# Patient Record
Sex: Female | Born: 1960 | Race: White | Hispanic: No | Marital: Married | Smoking: Never smoker
Health system: Southern US, Community
[De-identification: ages and names within clinical notes are randomized; demographics above are authoritative.]

## PROBLEM LIST (undated history)

## (undated) DIAGNOSIS — Z9221 Personal history of antineoplastic chemotherapy: Secondary | ICD-10-CM

## (undated) DIAGNOSIS — C50919 Malignant neoplasm of unspecified site of unspecified female breast: Principal | ICD-10-CM

## (undated) DIAGNOSIS — Z923 Personal history of irradiation: Secondary | ICD-10-CM

## (undated) DIAGNOSIS — I890 Lymphedema, not elsewhere classified: Secondary | ICD-10-CM

## (undated) HISTORY — DX: Malignant neoplasm of unspecified site of unspecified female breast: C50.919

## (undated) HISTORY — DX: Lymphedema, not elsewhere classified: I89.0

---

## 1998-02-06 ENCOUNTER — Other Ambulatory Visit: Admission: RE | Admit: 1998-02-06 | Discharge: 1998-02-06 | Payer: Self-pay | Admitting: *Deleted

## 1999-07-07 ENCOUNTER — Other Ambulatory Visit: Admission: RE | Admit: 1999-07-07 | Discharge: 1999-07-07 | Payer: Self-pay | Admitting: *Deleted

## 2000-07-27 ENCOUNTER — Other Ambulatory Visit: Admission: RE | Admit: 2000-07-27 | Discharge: 2000-07-27 | Payer: Self-pay | Admitting: *Deleted

## 2001-09-05 ENCOUNTER — Other Ambulatory Visit: Admission: RE | Admit: 2001-09-05 | Discharge: 2001-09-05 | Payer: Self-pay | Admitting: *Deleted

## 2002-09-10 ENCOUNTER — Other Ambulatory Visit: Admission: RE | Admit: 2002-09-10 | Discharge: 2002-09-10 | Payer: Self-pay | Admitting: *Deleted

## 2003-10-15 ENCOUNTER — Other Ambulatory Visit: Admission: RE | Admit: 2003-10-15 | Discharge: 2003-10-15 | Payer: Self-pay | Admitting: *Deleted

## 2005-04-15 ENCOUNTER — Observation Stay (HOSPITAL_COMMUNITY): Admission: RE | Admit: 2005-04-15 | Discharge: 2005-04-16 | Payer: Self-pay | Admitting: Gynecology

## 2005-04-15 ENCOUNTER — Encounter (INDEPENDENT_AMBULATORY_CARE_PROVIDER_SITE_OTHER): Payer: Self-pay | Admitting: Specialist

## 2006-03-13 ENCOUNTER — Ambulatory Visit: Payer: Self-pay | Admitting: Gynecology

## 2006-03-14 ENCOUNTER — Ambulatory Visit (HOSPITAL_COMMUNITY): Admission: RE | Admit: 2006-03-14 | Discharge: 2006-03-14 | Payer: Self-pay | Admitting: Gynecology

## 2007-03-19 ENCOUNTER — Ambulatory Visit: Payer: Self-pay | Admitting: Gynecology

## 2007-03-30 ENCOUNTER — Encounter: Admission: RE | Admit: 2007-03-30 | Discharge: 2007-03-30 | Payer: Self-pay | Admitting: Gynecology

## 2007-06-28 DIAGNOSIS — Z9221 Personal history of antineoplastic chemotherapy: Secondary | ICD-10-CM

## 2007-06-28 DIAGNOSIS — Z923 Personal history of irradiation: Secondary | ICD-10-CM

## 2007-06-28 HISTORY — PX: MASTECTOMY: SHX3

## 2007-06-28 HISTORY — DX: Personal history of antineoplastic chemotherapy: Z92.21

## 2007-06-28 HISTORY — DX: Personal history of irradiation: Z92.3

## 2008-01-26 HISTORY — PX: MASTECTOMY, MODIFIED RADICAL W/RECONSTRUCTION: SHX708

## 2008-03-04 ENCOUNTER — Encounter (INDEPENDENT_AMBULATORY_CARE_PROVIDER_SITE_OTHER): Payer: Self-pay | Admitting: Diagnostic Radiology

## 2008-03-04 ENCOUNTER — Encounter: Admission: RE | Admit: 2008-03-04 | Discharge: 2008-03-04 | Payer: Self-pay | Admitting: Gynecology

## 2008-03-05 HISTORY — PX: BREAST BIOPSY: SHX20

## 2008-03-11 ENCOUNTER — Encounter: Admission: RE | Admit: 2008-03-11 | Discharge: 2008-03-11 | Payer: Self-pay | Admitting: Gynecology

## 2008-03-26 ENCOUNTER — Ambulatory Visit (HOSPITAL_COMMUNITY): Admission: RE | Admit: 2008-03-26 | Discharge: 2008-03-27 | Payer: Self-pay | Admitting: Surgery

## 2008-03-26 ENCOUNTER — Encounter (INDEPENDENT_AMBULATORY_CARE_PROVIDER_SITE_OTHER): Payer: Self-pay | Admitting: Surgery

## 2008-03-27 ENCOUNTER — Ambulatory Visit: Payer: Self-pay | Admitting: Oncology

## 2008-04-02 ENCOUNTER — Ambulatory Visit: Payer: Self-pay | Admitting: Oncology

## 2008-04-07 LAB — CBC WITH DIFFERENTIAL (CANCER CENTER ONLY)
BASO%: 1.4 % (ref 0.0–2.0)
Eosinophils Absolute: 0.8 10*3/uL — ABNORMAL HIGH (ref 0.0–0.5)
LYMPH#: 3.2 10*3/uL (ref 0.9–3.3)
MCV: 89 fL (ref 81–101)
MONO#: 0.7 10*3/uL (ref 0.1–0.9)
NEUT#: 8 10*3/uL — ABNORMAL HIGH (ref 1.5–6.5)
Platelets: 381 10*3/uL (ref 145–400)
RBC: 4.35 10*6/uL (ref 3.70–5.32)
RDW: 11.3 % (ref 10.5–14.6)
WBC: 12.9 10*3/uL — ABNORMAL HIGH (ref 3.9–10.0)

## 2008-04-07 LAB — LACTATE DEHYDROGENASE: LDH: 157 U/L (ref 94–250)

## 2008-04-07 LAB — CMP (CANCER CENTER ONLY)
ALT(SGPT): 35 U/L (ref 10–47)
Alkaline Phosphatase: 51 U/L (ref 26–84)
CO2: 29 mEq/L (ref 18–33)
Creat: 0.5 mg/dl — ABNORMAL LOW (ref 0.6–1.2)
Sodium: 139 mEq/L (ref 128–145)
Total Bilirubin: 0.5 mg/dl (ref 0.20–1.60)
Total Protein: 7.7 g/dL (ref 6.4–8.1)

## 2008-04-07 LAB — CANCER ANTIGEN 27.29: CA 27.29: 26 U/mL (ref 0–39)

## 2008-04-10 ENCOUNTER — Ambulatory Visit (HOSPITAL_COMMUNITY): Admission: RE | Admit: 2008-04-10 | Discharge: 2008-04-10 | Payer: Self-pay | Admitting: Oncology

## 2008-04-15 ENCOUNTER — Ambulatory Visit: Admission: RE | Admit: 2008-04-15 | Discharge: 2008-04-29 | Payer: Self-pay | Admitting: Radiation Oncology

## 2008-04-18 ENCOUNTER — Ambulatory Visit (HOSPITAL_COMMUNITY): Admission: RE | Admit: 2008-04-18 | Discharge: 2008-04-18 | Payer: Self-pay | Admitting: Surgery

## 2008-04-22 LAB — CMP (CANCER CENTER ONLY)
ALT(SGPT): 28 U/L (ref 10–47)
AST: 26 U/L (ref 11–38)
Albumin: 4 g/dL (ref 3.3–5.5)
BUN, Bld: 11 mg/dL (ref 7–22)
CO2: 23 mEq/L (ref 18–33)
Calcium: 9.5 mg/dL (ref 8.0–10.3)
Chloride: 104 mEq/L (ref 98–108)
Creat: 0.3 mg/dl — ABNORMAL LOW (ref 0.6–1.2)
Potassium: 3.5 mEq/L (ref 3.3–4.7)

## 2008-04-22 LAB — CBC WITH DIFFERENTIAL (CANCER CENTER ONLY)
BASO#: 0.2 10*3/uL (ref 0.0–0.2)
BASO%: 1 % (ref 0.0–2.0)
Eosinophils Absolute: 0.1 10*3/uL (ref 0.0–0.5)
HCT: 39.3 % (ref 34.8–46.6)
HGB: 13.6 g/dL (ref 11.6–15.9)
LYMPH#: 1.6 10*3/uL (ref 0.9–3.3)
LYMPH%: 10.4 % — ABNORMAL LOW (ref 14.0–48.0)
MCV: 91 fL (ref 81–101)
MONO#: 0.6 10*3/uL (ref 0.1–0.9)
NEUT%: 84.1 % — ABNORMAL HIGH (ref 39.6–80.0)
RBC: 4.34 10*6/uL (ref 3.70–5.32)
RDW: 11.7 % (ref 10.5–14.6)
WBC: 15.6 10*3/uL — ABNORMAL HIGH (ref 3.9–10.0)

## 2008-04-29 LAB — CBC WITH DIFFERENTIAL (CANCER CENTER ONLY)
Platelets: 166 10*3/uL (ref 145–400)
RBC: 4.29 10*6/uL (ref 3.70–5.32)
WBC: 14.6 10*3/uL — ABNORMAL HIGH (ref 3.9–10.0)

## 2008-04-29 LAB — BASIC METABOLIC PANEL - CANCER CENTER ONLY
CO2: 28 mEq/L (ref 18–33)
Calcium: 9.1 mg/dL (ref 8.0–10.3)
Chloride: 96 mEq/L — ABNORMAL LOW (ref 98–108)
Sodium: 137 mEq/L (ref 128–145)

## 2008-04-29 LAB — MANUAL DIFFERENTIAL (CHCC SATELLITE)
ALC: 4.5 10*3/uL — ABNORMAL HIGH (ref 0.6–2.2)
ANC (CHCC HP manual diff): 8.2 10*3/uL — ABNORMAL HIGH (ref 1.5–6.7)
Band Neutrophils: 20 % — ABNORMAL HIGH (ref 0–10)
Eos: 6 % (ref 0–7)
PLT EST ~~LOC~~: ADEQUATE
Platelet Morphology: NORMAL
SEG: 34 % — ABNORMAL LOW (ref 40–75)

## 2008-05-12 LAB — CBC WITH DIFFERENTIAL (CANCER CENTER ONLY)
BASO#: 0.1 10*3/uL (ref 0.0–0.2)
EOS%: 1 % (ref 0.0–7.0)
HCT: 38.3 % (ref 34.8–46.6)
HGB: 13.2 g/dL (ref 11.6–15.9)
LYMPH%: 10.7 % — ABNORMAL LOW (ref 14.0–48.0)
MONO%: 1.1 % (ref 0.0–13.0)
NEUT#: 9.5 10*3/uL — ABNORMAL HIGH (ref 1.5–6.5)
Platelets: 442 10*3/uL — ABNORMAL HIGH (ref 145–400)
RBC: 4.26 10*6/uL (ref 3.70–5.32)
RDW: 12.6 % (ref 10.5–14.6)
WBC: 11.1 10*3/uL — ABNORMAL HIGH (ref 3.9–10.0)

## 2008-05-12 LAB — CMP (CANCER CENTER ONLY)
ALT(SGPT): 79 U/L — ABNORMAL HIGH (ref 10–47)
AST: 40 U/L — ABNORMAL HIGH (ref 11–38)
Albumin: 4.1 g/dL (ref 3.3–5.5)
Alkaline Phosphatase: 41 U/L (ref 26–84)
Potassium: 4.2 mEq/L (ref 3.3–4.7)
Sodium: 144 mEq/L (ref 128–145)
Total Bilirubin: 0.5 mg/dl (ref 0.20–1.60)
Total Protein: 8.1 g/dL (ref 6.4–8.1)

## 2008-05-15 ENCOUNTER — Ambulatory Visit: Payer: Self-pay | Admitting: Oncology

## 2008-05-20 LAB — CBC WITH DIFFERENTIAL (CANCER CENTER ONLY)
BASO%: 4.1 % — ABNORMAL HIGH (ref 0.0–2.0)
Eosinophils Absolute: 0.3 10*3/uL (ref 0.0–0.5)
LYMPH%: 26.8 % (ref 14.0–48.0)
MCH: 31.3 pg (ref 26.0–34.0)
MCV: 90 fL (ref 81–101)
MONO%: 31.4 % — ABNORMAL HIGH (ref 0.0–13.0)
NEUT#: 3.8 10*3/uL (ref 1.5–6.5)
Platelets: 191 10*3/uL (ref 145–400)
RBC: 4.13 10*6/uL (ref 3.70–5.32)
RDW: 12.4 % (ref 10.5–14.6)
WBC: 10.8 10*3/uL — ABNORMAL HIGH (ref 3.9–10.0)

## 2008-05-20 LAB — BASIC METABOLIC PANEL - CANCER CENTER ONLY
BUN, Bld: 11 mg/dL (ref 7–22)
Creat: 0.8 mg/dl (ref 0.6–1.2)
Glucose, Bld: 148 mg/dL — ABNORMAL HIGH (ref 73–118)

## 2008-05-20 LAB — TECHNOLOGIST REVIEW CHCC SATELLITE

## 2008-06-02 LAB — CBC WITH DIFFERENTIAL (CANCER CENTER ONLY)
BASO#: 0.1 10*3/uL (ref 0.0–0.2)
Eosinophils Absolute: 0.1 10*3/uL (ref 0.0–0.5)
HCT: 37.8 % (ref 34.8–46.6)
HGB: 12.8 g/dL (ref 11.6–15.9)
LYMPH#: 0.9 10*3/uL (ref 0.9–3.3)
MCV: 91 fL (ref 81–101)
MONO#: 0.1 10*3/uL (ref 0.1–0.9)
NEUT%: 88.1 % — ABNORMAL HIGH (ref 39.6–80.0)
WBC: 10.8 10*3/uL — ABNORMAL HIGH (ref 3.9–10.0)

## 2008-06-02 LAB — CMP (CANCER CENTER ONLY)
AST: 45 U/L — ABNORMAL HIGH (ref 11–38)
Albumin: 3.9 g/dL (ref 3.3–5.5)
Alkaline Phosphatase: 38 U/L (ref 26–84)
BUN, Bld: 11 mg/dL (ref 7–22)
Glucose, Bld: 173 mg/dL — ABNORMAL HIGH (ref 73–118)
Potassium: 4.6 mEq/L (ref 3.3–4.7)
Sodium: 140 mEq/L (ref 128–145)
Total Bilirubin: 0.5 mg/dl (ref 0.20–1.60)
Total Protein: 7.5 g/dL (ref 6.4–8.1)

## 2008-06-04 LAB — HEPATITIS B CORE ANTIBODY, TOTAL: Hep B Core Total Ab: NEGATIVE

## 2008-06-04 LAB — HEPATIC FUNCTION PANEL
ALT: 50 U/L — ABNORMAL HIGH (ref 0–35)
AST: 28 U/L (ref 0–37)
Bilirubin, Direct: 0.1 mg/dL (ref 0.0–0.3)
Indirect Bilirubin: 0.4 mg/dL (ref 0.0–0.9)
Total Protein: 6.7 g/dL (ref 6.0–8.3)

## 2008-06-04 LAB — HEPATITIS B SURFACE ANTIGEN: Hepatitis B Surface Ag: NEGATIVE

## 2008-06-04 LAB — GAMMA GT: GGT: 25 U/L (ref 7–51)

## 2008-06-09 ENCOUNTER — Ambulatory Visit (HOSPITAL_COMMUNITY): Admission: RE | Admit: 2008-06-09 | Discharge: 2008-06-09 | Payer: Self-pay | Admitting: Oncology

## 2008-06-10 LAB — CBC WITH DIFFERENTIAL (CANCER CENTER ONLY)
HGB: 12.3 g/dL (ref 11.6–15.9)
MCH: 30.9 pg (ref 26.0–34.0)
MCHC: 33.7 g/dL (ref 32.0–36.0)

## 2008-06-10 LAB — MANUAL DIFFERENTIAL (CHCC SATELLITE)
Band Neutrophils: 19 % — ABNORMAL HIGH (ref 0–10)
Eos: 3 % (ref 0–7)
LYMPH: 22 % (ref 14–48)
MONO: 17 % — ABNORMAL HIGH (ref 0–13)
PLT EST ~~LOC~~: ADEQUATE
Platelet Morphology: NORMAL
SEG: 35 % — ABNORMAL LOW (ref 40–75)

## 2008-06-10 LAB — CMP (CANCER CENTER ONLY)
AST: 28 U/L (ref 11–38)
Albumin: 3.8 g/dL (ref 3.3–5.5)
Alkaline Phosphatase: 57 U/L (ref 26–84)
BUN, Bld: 9 mg/dL (ref 7–22)
Creat: 0.9 mg/dl (ref 0.6–1.2)
Potassium: 3.8 mEq/L (ref 3.3–4.7)

## 2008-06-12 ENCOUNTER — Ambulatory Visit (HOSPITAL_COMMUNITY): Admission: RE | Admit: 2008-06-12 | Discharge: 2008-06-12 | Payer: Self-pay | Admitting: Oncology

## 2008-06-23 LAB — CBC WITH DIFFERENTIAL (CANCER CENTER ONLY)
BASO%: 1.2 % (ref 0.0–2.0)
LYMPH%: 17.4 % (ref 14.0–48.0)
MCV: 92 fL (ref 81–101)
MONO#: 0.9 10*3/uL (ref 0.1–0.9)
MONO%: 10.1 % (ref 0.0–13.0)
Platelets: 278 10*3/uL (ref 145–400)
RDW: 14 % (ref 10.5–14.6)
WBC: 9 10*3/uL (ref 3.9–10.0)

## 2008-06-23 LAB — LIPID PANEL
LDL Cholesterol: 136 mg/dL — ABNORMAL HIGH (ref 0–99)
VLDL: 30 mg/dL (ref 0–40)

## 2008-06-23 LAB — CMP (CANCER CENTER ONLY)
AST: 29 U/L (ref 11–38)
Albumin: 3.5 g/dL (ref 3.3–5.5)
Alkaline Phosphatase: 40 U/L (ref 26–84)
Potassium: 4 mEq/L (ref 3.3–4.7)
Sodium: 140 mEq/L (ref 128–145)
Total Protein: 6.7 g/dL (ref 6.4–8.1)

## 2008-06-30 ENCOUNTER — Ambulatory Visit: Payer: Self-pay | Admitting: Oncology

## 2008-07-01 LAB — CMP (CANCER CENTER ONLY)
ALT(SGPT): 24 U/L (ref 10–47)
Albumin: 3.6 g/dL (ref 3.3–5.5)
Alkaline Phosphatase: 53 U/L (ref 26–84)
Glucose, Bld: 136 mg/dL — ABNORMAL HIGH (ref 73–118)
Potassium: 3.8 mEq/L (ref 3.3–4.7)
Sodium: 139 mEq/L (ref 128–145)
Total Protein: 6.7 g/dL (ref 6.4–8.1)

## 2008-07-01 LAB — MANUAL DIFFERENTIAL (CHCC SATELLITE)
ALC: 2 10*3/uL (ref 0.6–2.2)
Blasts: 2 % — ABNORMAL HIGH (ref 0–0)
Eos: 3 % (ref 0–7)
MONO: 13 % (ref 0–13)
Metamyelocytes: 1 % — ABNORMAL HIGH (ref 0–0)
Platelet Morphology: NORMAL
nRBC: 1 % — ABNORMAL HIGH (ref 0–0)

## 2008-07-01 LAB — CBC WITH DIFFERENTIAL (CANCER CENTER ONLY)
HGB: 11.6 g/dL (ref 11.6–15.9)
Platelets: 147 10*3/uL (ref 145–400)
RBC: 3.69 10*6/uL — ABNORMAL LOW (ref 3.70–5.32)
WBC: 6 10*3/uL (ref 3.9–10.0)

## 2008-07-07 LAB — URINALYSIS, MICROSCOPIC (CHCC SATELLITE)
Blood: NEGATIVE
Ketones: NEGATIVE mg/dL
Nitrite: NEGATIVE
Protein: NEGATIVE mg/dL
Specific Gravity, Urine: 1.005 (ref 1.003–1.035)
pH: 7.5 (ref 4.60–8.00)

## 2008-07-22 LAB — CMP (CANCER CENTER ONLY)
Albumin: 3.9 g/dL (ref 3.3–5.5)
BUN, Bld: 7 mg/dL (ref 7–22)
CO2: 27 mEq/L (ref 18–33)
Calcium: 9.8 mg/dL (ref 8.0–10.3)
Chloride: 98 mEq/L (ref 98–108)
Glucose, Bld: 129 mg/dL — ABNORMAL HIGH (ref 73–118)
Potassium: 4.1 mEq/L (ref 3.3–4.7)
Sodium: 138 mEq/L (ref 128–145)
Total Protein: 6.4 g/dL (ref 6.4–8.1)

## 2008-07-22 LAB — CBC WITH DIFFERENTIAL (CANCER CENTER ONLY)
BASO#: 0.1 10*3/uL (ref 0.0–0.2)
EOS%: 3.3 % (ref 0.0–7.0)
HCT: 33 % — ABNORMAL LOW (ref 34.8–46.6)
HGB: 11.2 g/dL — ABNORMAL LOW (ref 11.6–15.9)
LYMPH#: 1.7 10*3/uL (ref 0.9–3.3)
LYMPH%: 42.6 % (ref 14.0–48.0)
MCH: 31.2 pg (ref 26.0–34.0)
MCHC: 34 g/dL (ref 32.0–36.0)
MCV: 92 fL (ref 81–101)
MONO%: 28.6 % — ABNORMAL HIGH (ref 0.0–13.0)
NEUT%: 23.4 % — ABNORMAL LOW (ref 39.6–80.0)

## 2008-08-04 LAB — CMP (CANCER CENTER ONLY)
BUN, Bld: 12 mg/dL (ref 7–22)
CO2: 29 mEq/L (ref 18–33)
Calcium: 9.6 mg/dL (ref 8.0–10.3)
Chloride: 103 mEq/L (ref 98–108)
Creat: 0.9 mg/dl (ref 0.6–1.2)

## 2008-08-04 LAB — CBC WITH DIFFERENTIAL (CANCER CENTER ONLY)
EOS%: 0.8 % (ref 0.0–7.0)
Eosinophils Absolute: 0.1 10*3/uL (ref 0.0–0.5)
HGB: 12.1 g/dL (ref 11.6–15.9)
LYMPH#: 1.2 10*3/uL (ref 0.9–3.3)
MCH: 31.3 pg (ref 26.0–34.0)
MONO%: 1.5 % (ref 0.0–13.0)
NEUT#: 7.8 10*3/uL — ABNORMAL HIGH (ref 1.5–6.5)
Platelets: 340 10*3/uL (ref 145–400)
RBC: 3.86 10*6/uL (ref 3.70–5.32)

## 2008-08-12 LAB — MANUAL DIFFERENTIAL (CHCC SATELLITE)
ANC (CHCC HP manual diff): 1.2 10*3/uL — ABNORMAL LOW (ref 1.5–6.7)
LYMPH: 53 % — ABNORMAL HIGH (ref 14–48)
Metamyelocytes: 5 % — ABNORMAL HIGH (ref 0–0)
Myelocytes: 4 % — ABNORMAL HIGH (ref 0–0)
nRBC: 2 % — ABNORMAL HIGH (ref 0–0)

## 2008-08-12 LAB — CMP (CANCER CENTER ONLY)
ALT(SGPT): 24 U/L (ref 10–47)
CO2: 28 mEq/L (ref 18–33)
Calcium: 9 mg/dL (ref 8.0–10.3)
Chloride: 100 mEq/L (ref 98–108)
Sodium: 140 mEq/L (ref 128–145)
Total Protein: 6.2 g/dL — ABNORMAL LOW (ref 6.4–8.1)

## 2008-08-12 LAB — CBC WITH DIFFERENTIAL (CANCER CENTER ONLY)
HCT: 32.3 % — ABNORMAL LOW (ref 34.8–46.6)
MCHC: 34.2 g/dL (ref 32.0–36.0)
Platelets: 147 10*3/uL (ref 145–400)
RDW: 12.3 % (ref 10.5–14.6)
WBC: 3.8 10*3/uL — ABNORMAL LOW (ref 3.9–10.0)

## 2008-08-13 ENCOUNTER — Ambulatory Visit: Admission: RE | Admit: 2008-08-13 | Discharge: 2008-10-26 | Payer: Self-pay | Admitting: Radiation Oncology

## 2008-08-21 ENCOUNTER — Ambulatory Visit: Payer: Self-pay | Admitting: Vascular Surgery

## 2008-08-21 ENCOUNTER — Encounter: Payer: Self-pay | Admitting: Radiation Oncology

## 2008-08-21 ENCOUNTER — Ambulatory Visit: Admission: RE | Admit: 2008-08-21 | Discharge: 2008-08-21 | Payer: Self-pay | Admitting: Radiation Oncology

## 2008-08-27 ENCOUNTER — Ambulatory Visit: Payer: Self-pay | Admitting: Oncology

## 2008-08-28 LAB — CBC WITH DIFFERENTIAL (CANCER CENTER ONLY)
BASO%: 1 % (ref 0.0–2.0)
EOS%: 1.3 % (ref 0.0–7.0)
HCT: 34.5 % — ABNORMAL LOW (ref 34.8–46.6)
LYMPH%: 31.6 % (ref 14.0–48.0)
MCH: 30.9 pg (ref 26.0–34.0)
MCHC: 33.3 g/dL (ref 32.0–36.0)
MCV: 93 fL (ref 81–101)
MONO#: 1 10*3/uL — ABNORMAL HIGH (ref 0.1–0.9)
MONO%: 11.6 % (ref 0.0–13.0)
NEUT%: 54.5 % (ref 39.6–80.0)
Platelets: 332 10*3/uL (ref 145–400)
RDW: 13.3 % (ref 10.5–14.6)
WBC: 8.2 10*3/uL (ref 3.9–10.0)

## 2008-08-28 LAB — CMP (CANCER CENTER ONLY)
BUN, Bld: 12 mg/dL (ref 7–22)
CO2: 27 mEq/L (ref 18–33)
Calcium: 9.3 mg/dL (ref 8.0–10.3)
Chloride: 106 mEq/L (ref 98–108)
Creat: 0.7 mg/dl (ref 0.6–1.2)
Glucose, Bld: 99 mg/dL (ref 73–118)

## 2008-09-03 ENCOUNTER — Encounter: Admission: RE | Admit: 2008-09-03 | Discharge: 2008-09-24 | Payer: Self-pay | Admitting: Oncology

## 2008-09-30 LAB — CBC WITH DIFFERENTIAL (CANCER CENTER ONLY)
BASO#: 0 10*3/uL (ref 0.0–0.2)
Eosinophils Absolute: 0.6 10*3/uL — ABNORMAL HIGH (ref 0.0–0.5)
HCT: 36.7 % (ref 34.8–46.6)
LYMPH%: 21.6 % (ref 14.0–48.0)
MCH: 30.2 pg (ref 26.0–34.0)
MCV: 90 fL (ref 81–101)
MONO#: 0.4 10*3/uL (ref 0.1–0.9)
MONO%: 8 % (ref 0.0–13.0)
NEUT%: 58.3 % (ref 39.6–80.0)
Platelets: 227 10*3/uL (ref 145–400)
RBC: 4.09 10*6/uL (ref 3.70–5.32)
WBC: 5.3 10*3/uL (ref 3.9–10.0)

## 2008-10-01 ENCOUNTER — Ambulatory Visit: Payer: Self-pay | Admitting: Genetic Counselor

## 2008-10-20 ENCOUNTER — Ambulatory Visit: Payer: Self-pay | Admitting: Obstetrics & Gynecology

## 2008-11-03 ENCOUNTER — Ambulatory Visit: Payer: Self-pay | Admitting: Oncology

## 2008-11-04 LAB — CMP (CANCER CENTER ONLY)
ALT(SGPT): 27 U/L (ref 10–47)
AST: 25 U/L (ref 11–38)
Calcium: 9.9 mg/dL (ref 8.0–10.3)
Chloride: 98 mEq/L (ref 98–108)
Creat: 0.6 mg/dl (ref 0.6–1.2)
Total Bilirubin: 0.6 mg/dl (ref 0.20–1.60)

## 2008-11-04 LAB — CBC WITH DIFFERENTIAL (CANCER CENTER ONLY)
BASO#: 0 10*3/uL (ref 0.0–0.2)
EOS%: 6.9 % (ref 0.0–7.0)
Eosinophils Absolute: 0.4 10*3/uL (ref 0.0–0.5)
HGB: 13.8 g/dL (ref 11.6–15.9)
LYMPH#: 1.3 10*3/uL (ref 0.9–3.3)
NEUT#: 3.2 10*3/uL (ref 1.5–6.5)
Platelets: 264 10*3/uL (ref 145–400)
RBC: 4.54 10*6/uL (ref 3.70–5.32)
WBC: 5.3 10*3/uL (ref 3.9–10.0)

## 2008-12-16 LAB — CMP (CANCER CENTER ONLY)
AST: 26 U/L (ref 11–38)
Albumin: 3.3 g/dL (ref 3.3–5.5)
BUN, Bld: 12 mg/dL (ref 7–22)
Calcium: 9.6 mg/dL (ref 8.0–10.3)
Chloride: 101 mEq/L (ref 98–108)
Potassium: 4 mEq/L (ref 3.3–4.7)

## 2008-12-16 LAB — CBC WITH DIFFERENTIAL (CANCER CENTER ONLY)
BASO#: 0 10*3/uL (ref 0.0–0.2)
Eosinophils Absolute: 0.3 10*3/uL (ref 0.0–0.5)
HGB: 12.6 g/dL (ref 11.6–15.9)
MCH: 30.3 pg (ref 26.0–34.0)
MCV: 86 fL (ref 81–101)
MONO#: 0.4 10*3/uL (ref 0.1–0.9)
MONO%: 7 % (ref 0.0–13.0)
NEUT#: 3.3 10*3/uL (ref 1.5–6.5)
RBC: 4.17 10*6/uL (ref 3.70–5.32)
WBC: 5.7 10*3/uL (ref 3.9–10.0)

## 2008-12-25 HISTORY — PX: BILATERAL OOPHORECTOMY: SHX1221

## 2009-01-07 ENCOUNTER — Ambulatory Visit: Payer: Self-pay | Admitting: Obstetrics & Gynecology

## 2009-01-07 ENCOUNTER — Ambulatory Visit (HOSPITAL_COMMUNITY): Admission: RE | Admit: 2009-01-07 | Discharge: 2009-01-08 | Payer: Self-pay | Admitting: Obstetrics & Gynecology

## 2009-01-07 ENCOUNTER — Encounter: Payer: Self-pay | Admitting: Obstetrics & Gynecology

## 2009-01-12 ENCOUNTER — Ambulatory Visit: Payer: Self-pay | Admitting: Oncology

## 2009-01-14 LAB — CMP (CANCER CENTER ONLY)
Albumin: 3.2 g/dL — ABNORMAL LOW (ref 3.3–5.5)
Alkaline Phosphatase: 61 U/L (ref 26–84)
BUN, Bld: 10 mg/dL (ref 7–22)
Creat: 0.7 mg/dl (ref 0.6–1.2)
Glucose, Bld: 106 mg/dL (ref 73–118)
Potassium: 4 mEq/L (ref 3.3–4.7)
Total Bilirubin: 0.4 mg/dl (ref 0.20–1.60)

## 2009-01-14 LAB — CBC WITH DIFFERENTIAL (CANCER CENTER ONLY)
BASO#: 0.1 10*3/uL (ref 0.0–0.2)
Eosinophils Absolute: 0.6 10*3/uL — ABNORMAL HIGH (ref 0.0–0.5)
HCT: 35.1 % (ref 34.8–46.6)
HGB: 12.4 g/dL (ref 11.6–15.9)
MCH: 30.6 pg (ref 26.0–34.0)
MONO%: 7.6 % (ref 0.0–13.0)
NEUT#: 3.8 10*3/uL (ref 1.5–6.5)
NEUT%: 55.4 % (ref 39.6–80.0)
RBC: 4.06 10*6/uL (ref 3.70–5.32)

## 2009-01-19 ENCOUNTER — Encounter: Admission: RE | Admit: 2009-01-19 | Discharge: 2009-01-19 | Payer: Self-pay | Admitting: Oncology

## 2009-02-23 ENCOUNTER — Ambulatory Visit: Payer: Self-pay | Admitting: Oncology

## 2009-02-24 LAB — CMP (CANCER CENTER ONLY)
ALT(SGPT): 32 U/L (ref 10–47)
AST: 28 U/L (ref 11–38)
Albumin: 4.2 g/dL (ref 3.3–5.5)
Alkaline Phosphatase: 65 U/L (ref 26–84)
Potassium: 3.8 mEq/L (ref 3.3–4.7)
Sodium: 143 mEq/L (ref 128–145)
Total Protein: 7.2 g/dL (ref 6.4–8.1)

## 2009-02-24 LAB — CBC WITH DIFFERENTIAL (CANCER CENTER ONLY)
BASO#: 0.1 10*3/uL (ref 0.0–0.2)
EOS%: 5.4 % (ref 0.0–7.0)
Eosinophils Absolute: 0.2 10*3/uL (ref 0.0–0.5)
HCT: 38.2 % (ref 34.8–46.6)
HGB: 13 g/dL (ref 11.6–15.9)
LYMPH#: 1.4 10*3/uL (ref 0.9–3.3)
MCHC: 34 g/dL (ref 32.0–36.0)
MONO#: 0.4 10*3/uL (ref 0.1–0.9)
NEUT#: 2.2 10*3/uL (ref 1.5–6.5)
RBC: 4.19 10*6/uL (ref 3.70–5.32)
WBC: 4.2 10*3/uL (ref 3.9–10.0)

## 2009-03-04 ENCOUNTER — Ambulatory Visit: Payer: Self-pay | Admitting: Obstetrics & Gynecology

## 2009-03-27 ENCOUNTER — Encounter: Admission: RE | Admit: 2009-03-27 | Discharge: 2009-03-27 | Payer: Self-pay | Admitting: Oncology

## 2009-05-21 IMAGING — CR DG CHEST 1V PORT
1 series · 1 of 1 positions shown · non-contrast
Comparison: None

CLINICAL DATA: Assess Port-A-Cath placement..  Right breast CA.

PORTABLE CHEST - 1 VIEW

[view not recorded]
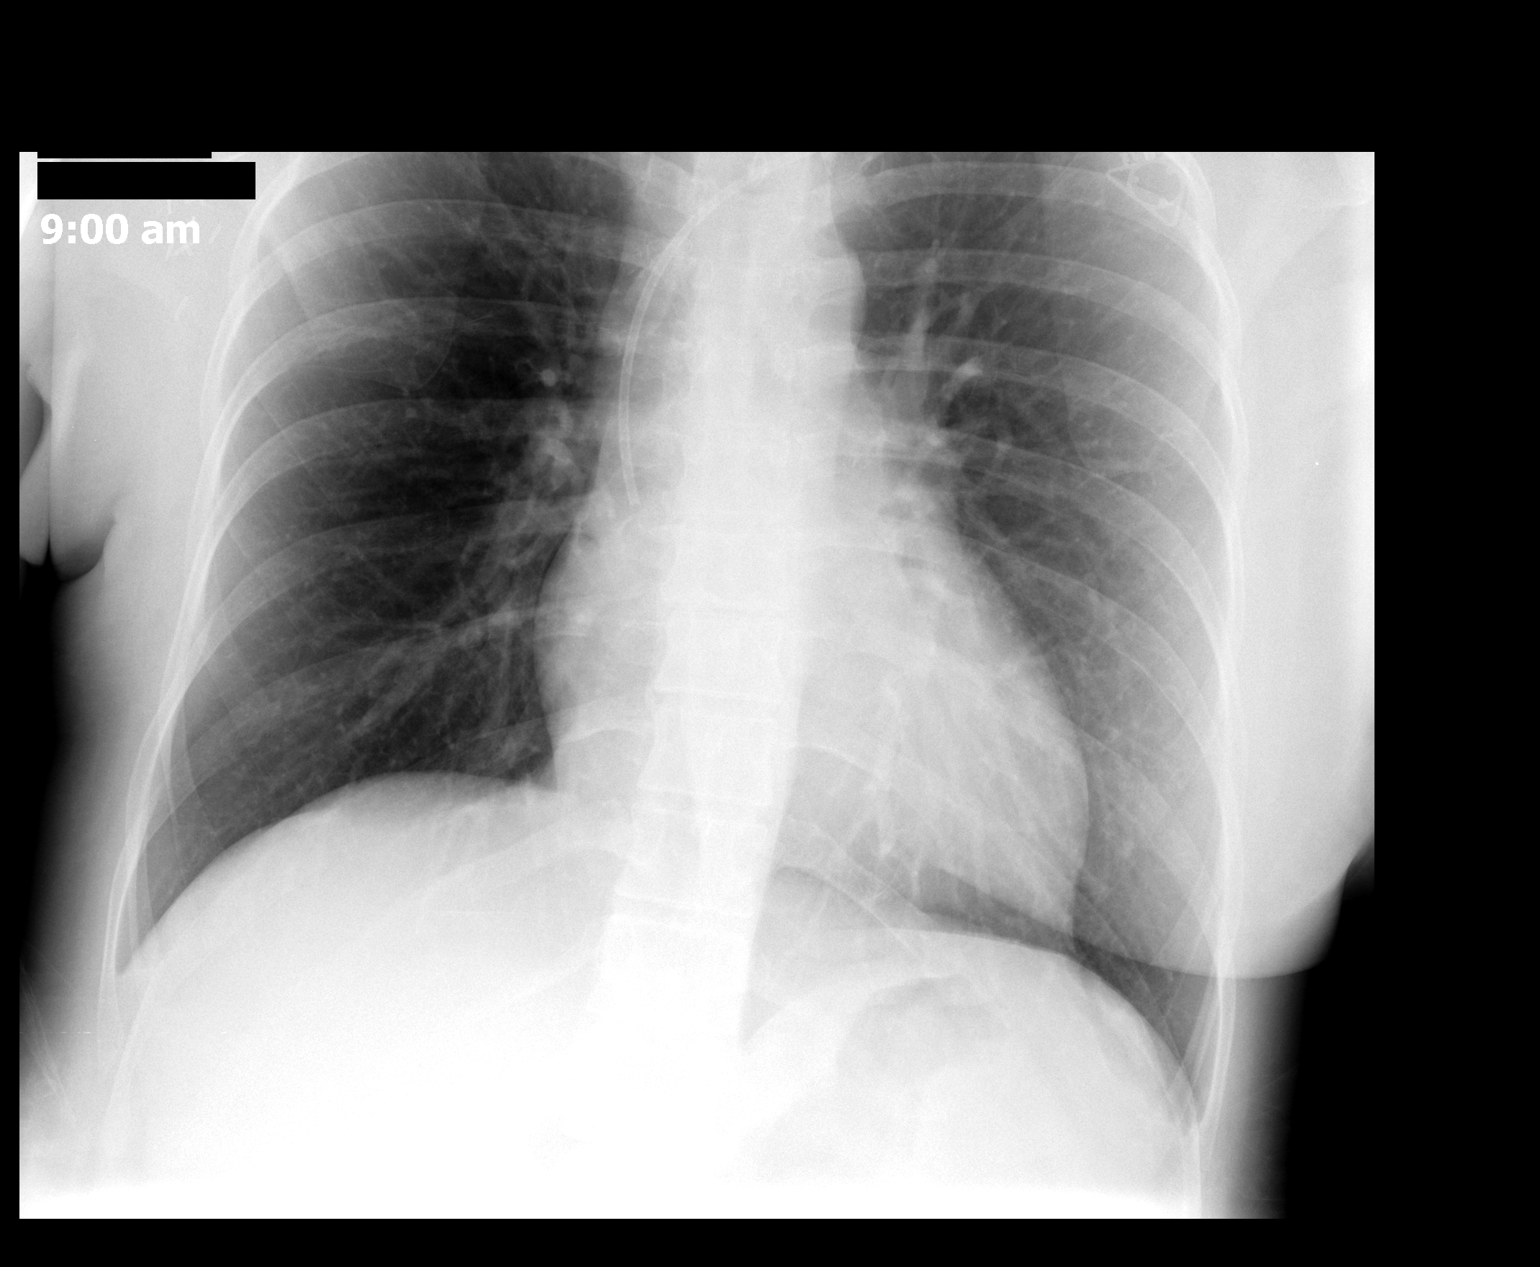

[1 of 1 positions shown; findings below may reference images not displayed]

FINDINGS: Port-A-Cath been inserted via the left subclavian vein
approach.  Catheter tip is in the lower SVC.  No pneumothorax.  No
evidence of active cardiopulmonary disease.
IMPRESSION: Port-A-Cath is in the lower SVC.  No pneumothorax.

## 2009-05-26 ENCOUNTER — Ambulatory Visit: Payer: Self-pay | Admitting: Oncology

## 2009-05-28 LAB — CMP (CANCER CENTER ONLY)
Albumin: 3.6 g/dL (ref 3.3–5.5)
BUN, Bld: 13 mg/dL (ref 7–22)
Calcium: 9.3 mg/dL (ref 8.0–10.3)
Chloride: 98 mEq/L (ref 98–108)
Creat: 0.6 mg/dl (ref 0.6–1.2)
Glucose, Bld: 93 mg/dL (ref 73–118)
Potassium: 3.8 mEq/L (ref 3.3–4.7)

## 2009-05-28 LAB — CBC WITH DIFFERENTIAL (CANCER CENTER ONLY)
BASO%: 0.6 % (ref 0.0–2.0)
EOS%: 12.9 % — ABNORMAL HIGH (ref 0.0–7.0)
LYMPH#: 1.8 10*3/uL (ref 0.9–3.3)
MCHC: 34.1 g/dL (ref 32.0–36.0)
MONO#: 0.5 10*3/uL (ref 0.1–0.9)
NEUT#: 3.2 10*3/uL (ref 1.5–6.5)
Platelets: 239 10*3/uL (ref 145–400)
RDW: 11 % (ref 10.5–14.6)
WBC: 6.4 10*3/uL (ref 3.9–10.0)

## 2009-07-12 IMAGING — US US ABDOMEN COMPLETE
1 series · 13 of 25 positions shown · non-contrast
Comparison: None

CLINICAL DATA: Elevated liver function tests.  Breast carcinoma.

ABDOMEN ULTRASOUND
TECHNIQUE: Complete abdominal ultrasound examination was performed
including evaluation of the liver, gallbladder, bile ducts,
pancreas, kidneys, spleen, IVC, and abdominal aorta.

[Series 1: unknown · 0.33mm/px · 13 of 82 slices shown]
[im 1/82]
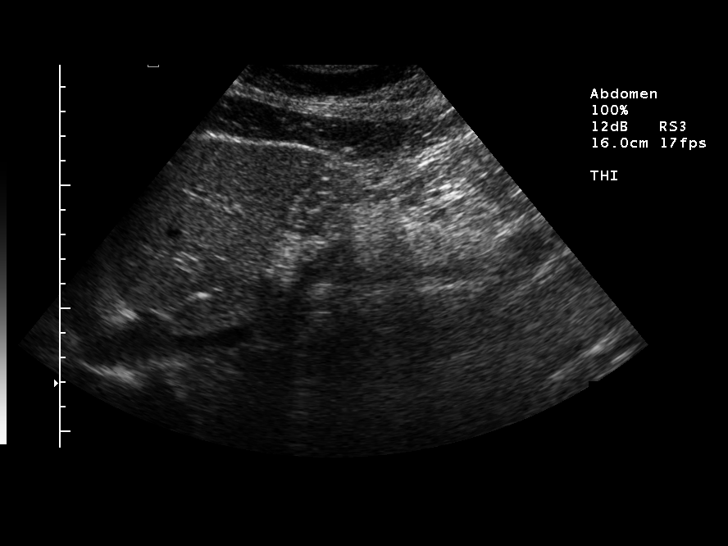
[im 7/82]
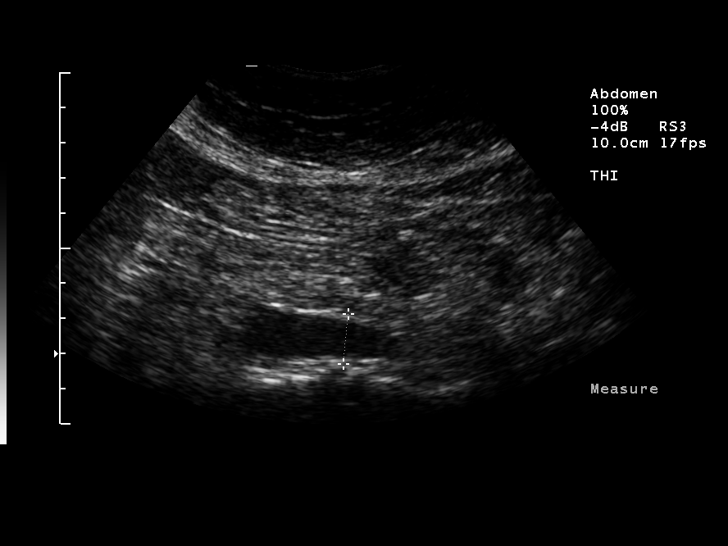
[im 14/82]
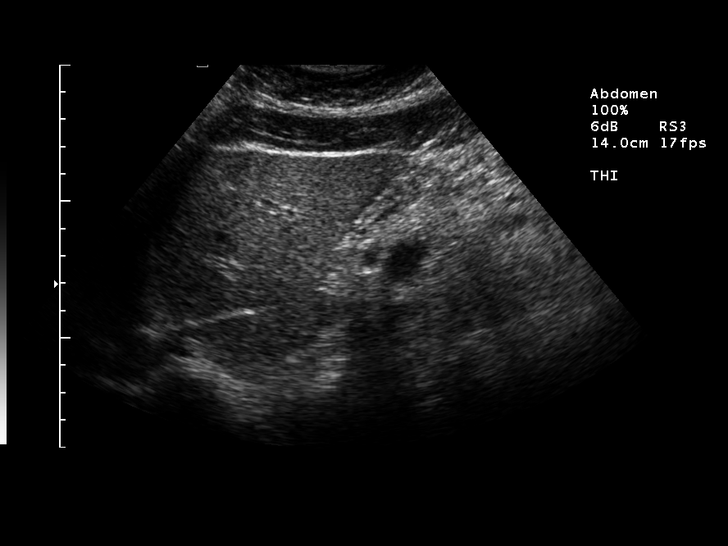
[im 21/82]
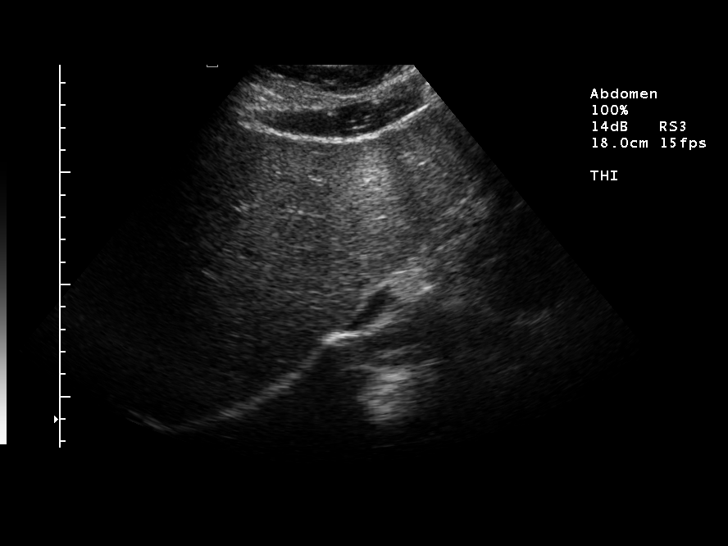
[im 28/82]
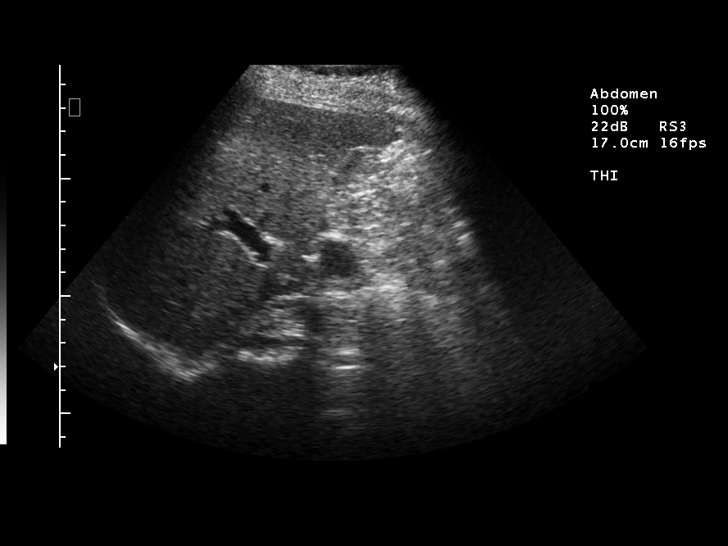
[im 34/82]
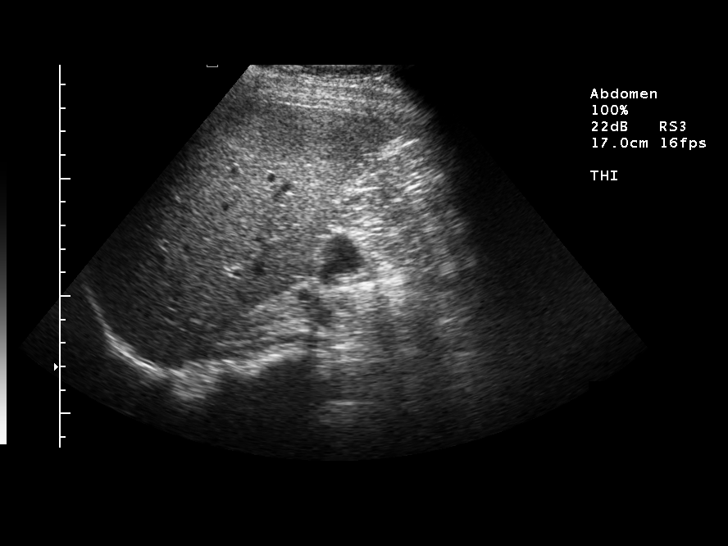
[im 41/82]
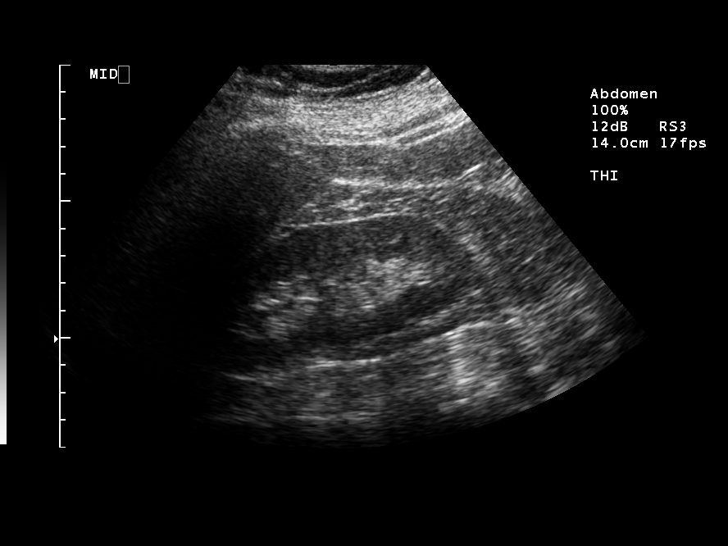
[im 48/82]
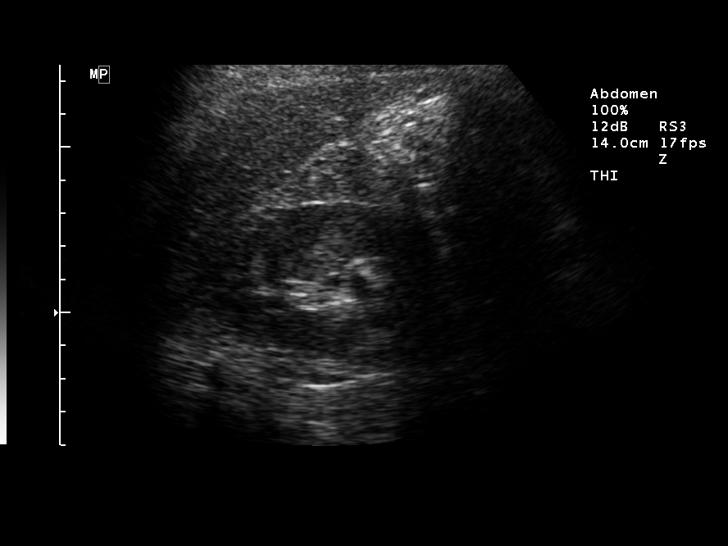
[im 55/82]
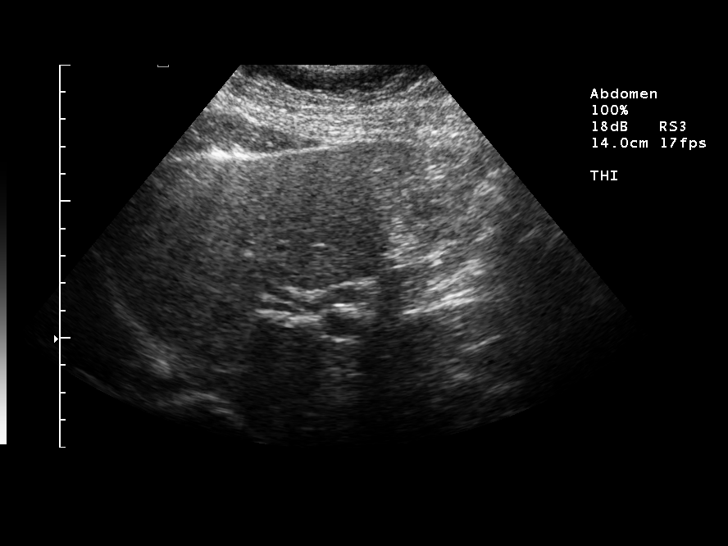
[im 61/82]
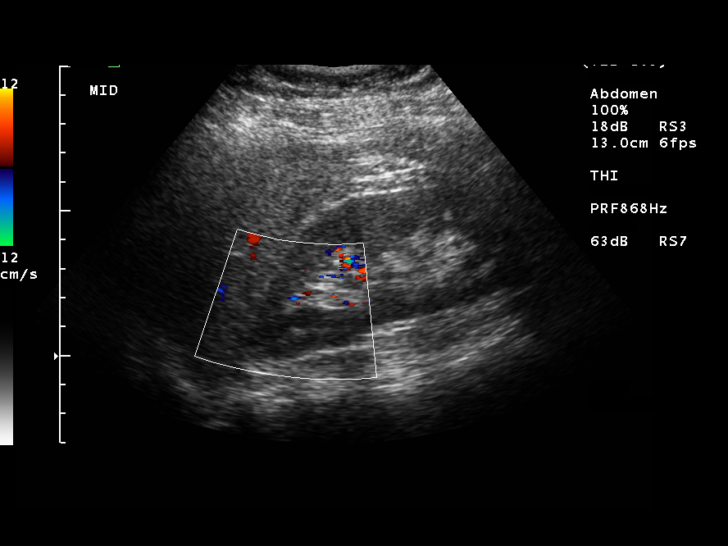
[im 68/82]
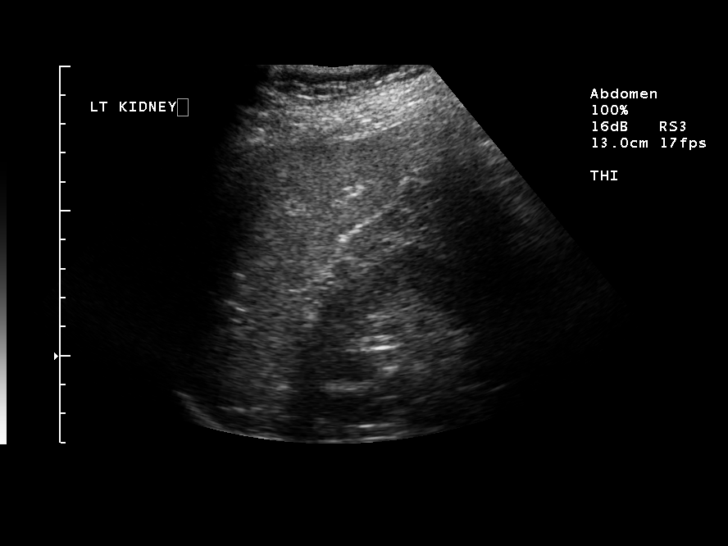
[im 75/82]
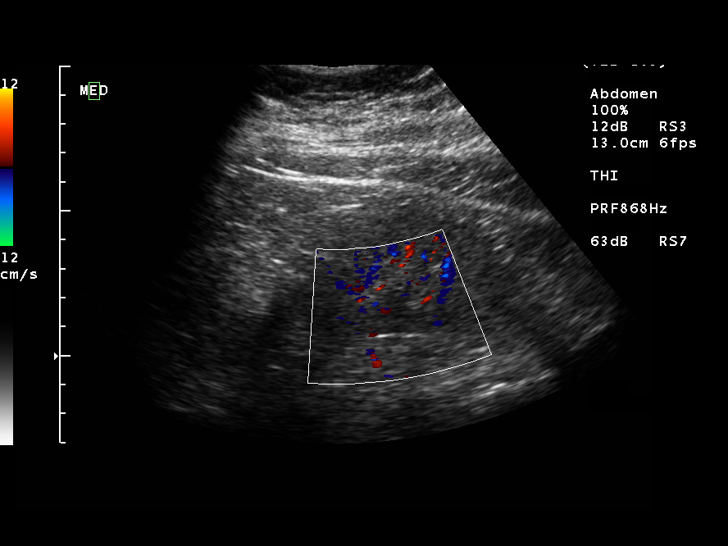
[im 82/82]
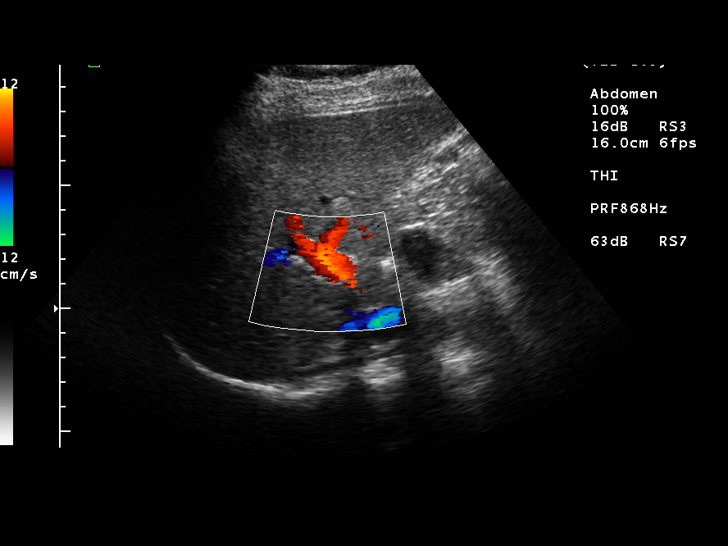

[13 of 25 positions shown; findings below may reference images not displayed]

FINDINGS: The gallbladder is surgically absent.  There is no
evidence of biliary ductal dilatation, with common bile duct
measuring approximately 5 mm.  The liver is within normal limits in
parenchymal echogenicity.  A subtle hyperechoic lesion is seen in
the right hepatic lobe measuring 9 mm in greatest diameter.  This
is nonspecific, and could represent a small hemangioma or
metastasis.  No other liver lesions are identified.

Visualized portions of the IVC and pancreas are unremarkable.
There is no evidence of splenomegaly.  Both kidneys are normal in
size in appearance.  There is no evidence of renal mass or
hydronephrosis.  There is no evidence of abdominal aortic aneurysm
or ascites.
IMPRESSION: 1.  Previous cholecystectomy.  No evidence of biliary ductal
dilatation.
2.  9 mm subtle hyperechoic lesion in the right hepatic lobe.
While this may represent a benign hemangioma, a small liver
metastasis cannot be excluded.  Abdomen MRI without with contrast
is recommend for further evaluation.

## 2010-01-07 ENCOUNTER — Ambulatory Visit: Payer: Self-pay | Admitting: Oncology

## 2010-01-13 LAB — CMP (CANCER CENTER ONLY)
Alkaline Phosphatase: 79 U/L (ref 26–84)
BUN, Bld: 12 mg/dL (ref 7–22)
CO2: 27 mEq/L (ref 18–33)
Creat: 0.8 mg/dl (ref 0.6–1.2)
Glucose, Bld: 88 mg/dL (ref 73–118)
Sodium: 133 mEq/L (ref 128–145)
Total Bilirubin: 0.7 mg/dl (ref 0.20–1.60)
Total Protein: 7.9 g/dL (ref 6.4–8.1)

## 2010-01-13 LAB — CBC WITH DIFFERENTIAL (CANCER CENTER ONLY)
BASO#: 0.1 10*3/uL (ref 0.0–0.2)
Eosinophils Absolute: 0.4 10*3/uL (ref 0.0–0.5)
HCT: 36.2 % (ref 34.8–46.6)
HGB: 12.5 g/dL (ref 11.6–15.9)
LYMPH%: 28.4 % (ref 14.0–48.0)
MCH: 31 pg (ref 26.0–34.0)
MCV: 90 fL (ref 81–101)
MONO#: 0.6 10*3/uL (ref 0.1–0.9)
MONO%: 7.4 % (ref 0.0–13.0)
NEUT%: 58.8 % (ref 39.6–80.0)
Platelets: 301 10*3/uL (ref 145–400)
RBC: 4.04 10*6/uL (ref 3.70–5.32)
WBC: 8 10*3/uL (ref 3.9–10.0)

## 2010-01-13 LAB — CANCER ANTIGEN 27.29: CA 27.29: 25 U/mL (ref 0–39)

## 2010-03-29 ENCOUNTER — Encounter: Admission: RE | Admit: 2010-03-29 | Discharge: 2010-03-29 | Payer: Self-pay | Admitting: Plastic Surgery

## 2010-04-01 ENCOUNTER — Encounter: Admission: RE | Admit: 2010-04-01 | Discharge: 2010-04-01 | Payer: Self-pay | Admitting: Plastic Surgery

## 2010-04-05 ENCOUNTER — Encounter: Admission: RE | Admit: 2010-04-05 | Discharge: 2010-04-05 | Payer: Self-pay | Admitting: Plastic Surgery

## 2010-04-07 HISTORY — PX: BREAST BIOPSY: SHX20

## 2010-07-12 ENCOUNTER — Ambulatory Visit: Payer: Self-pay | Admitting: Oncology

## 2010-07-14 LAB — CBC WITH DIFFERENTIAL/PLATELET
BASO%: 0.2 % (ref 0.0–2.0)
Basophils Absolute: 0 10*3/uL (ref 0.0–0.1)
EOS%: 2.6 % (ref 0.0–7.0)
Eosinophils Absolute: 0.2 10*3/uL (ref 0.0–0.5)
HCT: 43 % (ref 34.8–46.6)
HGB: 14.8 g/dL (ref 11.6–15.9)
LYMPH%: 31.6 % (ref 14.0–49.7)
MCH: 30.8 pg (ref 25.1–34.0)
MCHC: 34.3 g/dL (ref 31.5–36.0)
MCV: 90 fL (ref 79.5–101.0)
MONO#: 0.6 10*3/uL (ref 0.1–0.9)
MONO%: 8.4 % (ref 0.0–14.0)
NEUT#: 4 10*3/uL (ref 1.5–6.5)
NEUT%: 57.2 % (ref 38.4–76.8)
Platelets: 253 10*3/uL (ref 145–400)
RBC: 4.78 10*6/uL (ref 3.70–5.45)
RDW: 12.6 % (ref 11.2–14.5)
WBC: 7 10*3/uL (ref 3.9–10.3)
lymph#: 2.2 10*3/uL (ref 0.9–3.3)

## 2010-07-14 LAB — CANCER ANTIGEN 27.29: CA 27.29: 32 U/mL (ref 0–39)

## 2010-07-14 LAB — COMPREHENSIVE METABOLIC PANEL
ALT: 18 U/L (ref 0–35)
AST: 21 U/L (ref 0–37)
Albumin: 4.8 g/dL (ref 3.5–5.2)
Alkaline Phosphatase: 77 U/L (ref 39–117)
BUN: 11 mg/dL (ref 6–23)
CO2: 26 mEq/L (ref 19–32)
Calcium: 10.3 mg/dL (ref 8.4–10.5)
Chloride: 102 mEq/L (ref 96–112)
Creatinine, Ser: 0.64 mg/dL (ref 0.40–1.20)
Glucose, Bld: 90 mg/dL (ref 70–99)
Potassium: 4.1 mEq/L (ref 3.5–5.3)
Sodium: 141 mEq/L (ref 135–145)
Total Bilirubin: 0.5 mg/dL (ref 0.3–1.2)
Total Protein: 7.9 g/dL (ref 6.0–8.3)

## 2010-08-09 ENCOUNTER — Encounter: Payer: Self-pay | Admitting: Oncology

## 2010-08-26 ENCOUNTER — Encounter: Payer: Self-pay | Admitting: Oncology

## 2010-09-26 ENCOUNTER — Encounter: Payer: Self-pay | Admitting: Oncology

## 2010-10-03 LAB — CBC
HCT: 38.6 % (ref 36.0–46.0)
MCHC: 34.4 g/dL (ref 30.0–36.0)
MCV: 90.8 fL (ref 78.0–100.0)
Platelets: 236 10*3/uL (ref 150–400)
RBC: 4.25 MIL/uL (ref 3.87–5.11)
RDW: 12.9 % (ref 11.5–15.5)
WBC: 5.2 10*3/uL (ref 4.0–10.5)

## 2010-10-03 LAB — COMPREHENSIVE METABOLIC PANEL
AST: 25 U/L (ref 0–37)
Albumin: 3.9 g/dL (ref 3.5–5.2)
Alkaline Phosphatase: 50 U/L (ref 39–117)
BUN: 10 mg/dL (ref 6–23)
CO2: 25 mEq/L (ref 19–32)
Chloride: 105 mEq/L (ref 96–112)
Creatinine, Ser: 0.6 mg/dL (ref 0.4–1.2)
GFR calc Af Amer: >60 mL/min (ref >60)
GFR calc non Af Amer: >60 mL/min (ref >60)
Potassium: 3.7 mEq/L (ref 3.5–5.1)
Total Bilirubin: 0.5 mg/dL (ref 0.3–1.2)

## 2010-11-09 NOTE — Discharge Summary (Signed)
NAMESABRINNA, Tina Potter                ACCOUNT NO.:  [id number]   MEDICAL RECORD NO.:  [id number]          PATIENT TYPE:  OIB   LOCATION:  9320                          FACILITY:  [facility]   PHYSICIAN:  Allie Bossier, MD        DATE OF BIRTH:  [birth year]   DATE OF ADMISSION:  01/07/2009  DATE OF DISCHARGE:  01/08/2009                               DISCHARGE SUMMARY   DISPOSITION:  Home.   DIET:  As tolerated.   MEDICATIONS:  Percocet 325/5 mg 1 p.o. q.4 h p.r.n. pain #30, no  refills.   ADMISSION DIAGNOSIS:  Estrogen receptor positive breast cancer.   DISCHARGE DIAGNOSIS:  Estrogen receptor positive breast cancer.   PROCEDURE:  Laparoscopy, exploratory laparotomy, lysis of adhesions,  bilateral salpingo-oophorectomy, and removal of skin tag.  She will  follow up in 6 weeks or sooner as necessary.  She may return to work the  following Monday or later as necessary.   BRIEF HISTORY OF PRESENT ILLNESS AND HOSPITAL COURSE:  Tina Potter is a 50-  year-old, married white lady, who has had a diagnosis of estrogen  receptor positive breast cancer.  She has been advised by her oncologist  to have her adnexa removed.  We had planned to do this procedure  laparoscopically and she underwent a laparoscopy, where dense omental  adhesions were seen in the upper and lower abdomen, therefore converted  the surgery to a exploratory laparotomy, I lysed many adhesions, and  then removed both adnexa.  She also had a skin tag on her right lower  quadrant that she wished to have removed and I removed this as well.  The pathology of her ovaries came back without evidence of malignancies  on both sides and the entire ovary and tubes were removed on both sides.  Postoperatively, she did well.  By postop day #1, she was passing gas,  voiding, ambulating, and tolerating p.o. without nausea or vomiting.  She remained afebrile throughout her hospital course.  Her postop  hemoglobin was 11.5.  Her preop hemoglobin was 13.5.   Her vitals were  stable and she was discharged home.  She will follow up as above.      Allie Bossier, MD  Electronically Signed     MCD/MEDQ  D:  01/27/2009  T:  01/27/2009  Job:  [id number]

## 2010-11-09 NOTE — Discharge Summary (Signed)
NAME:  Tina Potter, Tina Potter                ACCOUNT NO.:  [id number]   MEDICAL RECORD NO.:  [id number]          PATIENT TYPE:  OIB   LOCATION:  5159                         FACILITY:  [facility]   PHYSICIAN:  Maisie Fus A. Cornett, M.D.DATE OF BIRTH:  1960-12-04   DATE OF ADMISSION:  03/26/2008  DATE OF DISCHARGE:  03/27/2008                               DISCHARGE SUMMARY   ADMITTING DIAGNOSIS:  Right breast cancer.   DISCHARGE DIAGNOSIS:  Right breast cancer.   PROCEDURES PERFORMED:  1. Right simple mastectomy.  2. Right axillary lymph node dissection.   BRIEF HISTORY:  The patient is a 50 year old female with right breast  cancer.  This is multifocal, and she presents for right mastectomy.   HOSPITAL COURSE:  The patient's hospital course unremarkable.  On  postoperative day #1, she is afebrile and tolerating her diet.  Wound  was clean, dry, and intact.  JP drains were functioning properly and she  had been instructed on drain care.   DISCHARGE INSTRUCTIONS:  The patient was discharged home on  postoperative day #1.  She will follow up in 1 week to have her drains  removed.  She will resume regular diet and will be given prescription  for Vicodin 1-2 tablets q.4 h. p.r.n. for pain.  We will make  arrangements for her to see medical oncologist as an outpatient.   CONDITION ON DISCHARGE:  Stable.      Thomas A. Cornett, M.D.  Electronically Signed     TAC/MEDQ  D:  03/27/2008  T:  03/27/2008  Job:  [id number]

## 2010-11-09 NOTE — Op Note (Signed)
NAME:  Tina Potter, Tina Potter                ACCOUNT NO.:  [id number]   MEDICAL RECORD NO.:  [id number]          PATIENT TYPE:  OIB   LOCATION:  9320                          FACILITY:  [facility]   PHYSICIAN:  Allie Bossier, MD        DATE OF BIRTH:  December 31, 1960   DATE OF PROCEDURE:  DATE OF DISCHARGE:                               OPERATIVE REPORT   PREOPERATIVE DIAGNOSIS:  Estrogen receptor positive breast cancer.   POSTOPERATIVE DIAGNOSES:  Estrogen receptor positive breast cancer plus  dense adhesions of her abdomen and pelvis.   PROCEDURES:  1. Laparoscopy.  2. Exploratory laparotomy.  3. Lysis of adhesions.  4. Bilateral salpingo-oophorectomy.  5. Removal of a skin tag   SURGEON:  Allie Bossier, MD   ANESTHESIA:  Oda Cogan.   COMPLICATIONS:  None.   ESTIMATED BLOOD LOSS:  100 mL.   SPECIMENS:  Bilateral ovaries and tubes.   DETAILED PROCEDURE AND FINDINGS:  Risks, benefits, and alternatives of  surgery were explained, understood, and accepted.  In the operating  room, she was placed in dorsal lithotomy position.  Her abdomen and  vagina were prepped and draped in usual sterile fashion.  Please note  this was done after she had an uncomplicated intubation.  Her bladder  was emptied with a Robinson catheter.  Bimanual exam revealed no masses.  A sponge on a stick was placed in her vagina (she has had a previous  hysterectomy).  Gloves were changed.  Attention was turned to the  abdomen.  A vertical umbilical incision was made at the site of her  previous laparoscopic cholecystectomy, carried the incision down to the  fascia.  The fascia was elevated with Kocher clamps and then incised  with curved Mayo scissors.  I used hemostats to open the peritoneal  cavity, what I found was a max of adhesions.  I did place a single  incision-laparoscopy port in there and used the scope to visualize  multiple adhesions in the abdomen.  I was not able to see all the way  into the pelvis with the  laparoscope due to the upper abdominal  adhesions.  It was apparent that I was not going to be able to approach  her adnexa laparoscopically.  I removed the laparoscope port and  laparoscope.  Please note that during intubation, the patient's  abdominal pressure was always less than 15.  The fascia was elevated and  closed with a 0 Vicryl running nonlocking suture.  No defects were  palpable.  The subcutaneous closure was done with a 3-0 Vicryl suture  and a bandage was placed.  At this point, I ungowned and ungloved and  went and spoke with Tina Potter' husband and explained that I was not  able to accomplish the removal of her adnexa through the laparoscopic.  I offered to go ahead and proceed with a laparotomy and removal of her  adnexa that way.  I also offered to remove a 1.5 cm clearly benign skin  tag that was in her right lower abdomen.  He agreed on both counts.  She  was given IV antibiotics.  A transverse incision was made at the site of  her previous incision.  Incision was carried down through the  subcutaneous tissue of the fascia.  Bleeding encountered was cauterized  with the Bovie.  Fascial incision was extended bilaterally and curved  slightly upwards.  The middle 20% of the rectus muscles were separated  in transverse fashion using electrosurgical technique and the peritoneum  was entered with hemostats.  Peritoneal incision was extended with the  Bovie.  There were moderate amount adhesions of the bowel to the pelvis.  These were taken down with Metzenbaum and blunt dissection.  The bowel  was then packed into the upper abdomen with a roll of moist lap sponges.  A self O'Connor-O'Sullivan self-retaining retractor was placed.  I was  able to visualize the right adnexa easily.  The ureter was easily  visible as well.  The IP ligament was identified, clamped, cut and  doubly ligated.  A 2-0 Vicryl suture was used.  The ovary was somewhat  adherent to the bladder and these  adhesions were taken down carefully  with sharp and blunt dissection.  No bleeding was noted after the adnexa  was removed.  The left ovary was not even visible due to adhesions over  it.  I was able to carefully remove overlying adhesions of omentum and  bowel to the left adnexa.  This was done with blunt and sharp  dissection, taking care to avoid the injury to the bowel or bladder.  Once the bowel was out of the way, I was able to visualize the adnexa.  It was moderately adherent to the rectosigmoid colon posteriorly and the  bladder anteriorly.  Using blunt and sharp dissection, I was able to  free it up from its adhesions.  I was then able to identify the ureter  on the left and the IP ligament.  The IP ligament was clamped, cut, and  ligated.  The adnexa was removed and the pelvis was irrigated with a  liter of warm normal saline.  All pedicles were noted to be hemostatic.  The sponges and retractor were removed.  The peritoneum was closed with  a 2-0 Vicryl running nonlocking suture.  The rectus fascia as well as  the rectus muscles were noted to be hemostatic.  The rectus fascia was  closed with a 0 Vicryl running nonlocking suture.  No defects were  palpable.  The subcutaneous tissue was irrigated, cleaned and dried.  It  was then infiltrated with 13 mL of 0.5% Marcaine.  A subcuticular  closure was done with 3-0 Vicryl suture.  Steri-Strips were placed.  Her  Foley catheter drained clear urine throughout.  She was taken to  recovery room in stable condition.       Allie Bossier, MD  Electronically Signed     MCD/MEDQ  D:  01/07/2009  T:  01/08/2009  Job:  [id number]

## 2010-11-09 NOTE — Assessment & Plan Note (Signed)
NAMECHRISTI, Potter NO.:  [id number]   MEDICAL RECORD NO.:  [id number]           PATIENT TYPE:   LOCATION:  [facility]           FACILITY:   PHYSICIAN:  Allie Bossier, MD        DATE OF BIRTH:  09/10/1960   DATE OF SERVICE:                                  CLINIC NOTE   Tina Potter is a 50 year old married white, G1, P1, who is here for her annual  exam as well as to schedule a BSO.  She was diagnosed with stage II  right invasive ductal carcinoma and was treated with chemotherapy and  radiation (she finished her radiation on Jocee 4, 2010).  She is  currently undergoing BRCA testing and wishes her ovaries removed because  her pathology showed the tumor to be estrogen-receptor positive.  She  already is experiencing menopausal symptoms since her chemotherapy.   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   PAST SURGICAL HISTORY:  Mastectomy and lymphadenectomy, cholecystectomy,  vaginal hysterectomy in 2006 for dysfunctional uterine bleeding, a C-  section, and a Port-A-Cath placement.   Social history is negative for tobacco, alcohol or drug use.  She does  report that she drinks an alcoholic beverage about once a month.   No known drug allergies.  No latex allergies.   Family history is negative for breast, GYN, and colon malignancies, is  positive for leukemia in her mother who is deceased and liver cancer in  her father who is deceased.   REVIEW OF SYSTEMS:  She is married and sexually active.  She says that  are she is already experiencing vaginal atrophy, but does not use any  lubricants.  She denies significant dyspareunia.  She has a Pap smear  last done in 2008 and was normal and has always been normal.   PHYSICAL EXAMINATION:  VITAL SIGNS:  Weight 177 pounds, height 5 feet 7  inches, blood pressure 113/68, pulse 67.  HEENT:  Normal.  CHEST:  Her chest wall is  significant for radiation changes on the  right.  She has a Port-A-Cath placement on the left.  Her left breast is  dense with no specific skin changes, nipple discharges, or masses.  ABDOMEN:  Benign.  She has a C-section scar and scars from her  cholecystectomy.  EXTERNAL GENITALIA:  She has moderate amount of atrophy.  Her vaginal  cuff is without prolapse.  There is no masses on bimanual.   ASSESSMENT AND PLAN:  1. Annual exam.  Recommended self-breast exams and self-vulvar exams.  2. Estrogen-receptor breast cancer.  I will plan for a laparoscopic      BSO.  The risks of surgery including but not limited to infection,      bleeding, damage to bowel, bladder, and ureter, she understands      these, wishes to proceed.  Of note, her son has a wedding in June      and she would like to have her surgery done prior to this.  We      discussed inpatient versus outpatient status.  At this point, she      would like to aim for being an outpatient.  All her questions were      answered.      Allie Bossier, MD     MCD/MEDQ  D:  10/20/2008  T:  10/21/2008  Job:  [id number]

## 2010-11-09 NOTE — Op Note (Signed)
NAME:  Tina Potter, BAINES                ACCOUNT NO.:  [id number]   MEDICAL RECORD NO.:  [id number]          PATIENT TYPE:  OIB   LOCATION:  5159                         FACILITY:  [facility]   PHYSICIAN:  Maisie Fus A. Potter, M.D.DATE OF BIRTH:  March 10, 1961   DATE OF PROCEDURE:  03/26/2008  DATE OF DISCHARGE:                               OPERATIVE REPORT   PREOPERATIVE DIAGNOSIS:  Multifocal right breast cancer.   POSTOPERATIVE DIAGNOSIS:  Multifocal right breast cancer.   PROCEDURES:  1. Right simple mastectomy.  2. Right axillary sentinel lymph node mapping with injection of      methylene blue dye.  3. Right axillary lymph node dissection for positive sentinel lymph      node.   SURGEON:  Maisie Fus A. Cornett, MD   ASSISTANT:  Kelle Darting. Rennis Harding, NP   ANESTHESIA:  LMA.   DRAINS:  Two 19 Blake drains to mastectomy incisions.   SPECIMEN:  1. Right breast tissue.  2. Right axillary lymph nodes.   ESTIMATED BLOOD LOSS:  30 mL.   INDICATIONS FOR PROCEDURE:  The patient is a 50 year old female with  multifocal right breast cancer.  She presents today for right simple  mastectomy for what appears to be clinical stage I right breast cancer  given her multifocal nature.  She also has some sentinel lymph node  mapping.   DESCRIPTION OF PROCEDURE:  The patient was brought to the operating room  and placed supine.  She underwent nuclear medicine injection  preoperatively.  Right breast was then prepped and draped in sterile  fashion after induction of anesthesia.  Elliptical incisions were made  to create superior and inferior skin flaps above the nipple-areolar  complex.  Superior skin flap was then taken up toward the clavicle.  Prior to this, 4 mL of methylene blue dye were injected in a subareolar  position.  Massage was undertaken for 5 minutes.  After creation of  superior skin flap, there was an area of necrosis though in the upper  outer superior skin flap noted due to cautery injury.   I went ahead and  excised the small area.  The inferior skin flap was also created in a  similar fashion down to the inferior mammary fold.  NeoProbe was used to  detect the sentinel node after creation of the superior skin flap.  Three sentinel nodes were identified blue and hot, one had tumor cells  by touch prep.  At this point in time, once the skin flaps were created,  we excised the breast tissue down to the fascia of the pectoralis major  in medial-to-lateral fashion until we entered the axilla.  Once we got  into the axilla, axillary lymph node was completed.  The axillary vein  was identified.  Small branches of this were taken with clips.  Axillary  contents were then rolled out of the axilla.  The long thoracic nerve  was preserved as well as the thoracodorsal trunk.  She has had another  blue node noted in her axillary node dissection.  All nodal tissue  between the above landmarks were excised  using clips to control any  small lymphatics.  The intercostal brachial nerves were sacrificed.  Once the axillary contents were removed, the right axilla was examined.  Hemostasis was achieved with cautery.  Surgicel was placed.  Through two  separate stab wounds, 19 Blake drains were placed.  I inspected the  flaps after irrigating these areas out carefully and found them to be  hemostatic.  No signs of any bleeding from either flap at this point in  time or in the muscle_.  The drains were secured with 2-0 nylon.  The  wound was closed in layers with a deep layer of 3-0 Vicryl and  subsequent 4-0 Monocryl layer.  Dermabond was applied.  All final counts  of sponge, needle, and instruments were found to be correct at this  portion of the case.  The patient was awoken and taken to recovery in  satisfactory condition.  All final counts were correct.      Tina Potter, M.D.  Electronically Signed     TAC/MEDQ  D:  03/26/2008  T:  03/27/2008  Job:  [id number]   cc:   Oliver Hum, MD  [facility]  Ginger Carne, MD

## 2010-11-09 NOTE — Op Note (Signed)
NAME:  Tina Potter, Tina Potter                ACCOUNT NO.:  [id number]   MEDICAL RECORD NO.:  [id number]          PATIENT TYPE:  AMB   LOCATION:  SDS                          FACILITY:  [facility]   PHYSICIAN:  Thomas A. Cornett, M.D.DATE OF BIRTH:  28-Aug-1960   DATE OF PROCEDURE:  04/18/2008  DATE OF DISCHARGE:                               OPERATIVE REPORT   PREOPERATIVE DIAGNOSIS:  Right breast cancer with poor venous access in  need of chemotherapy.   POSTOPERATIVE DIAGNOSIS:  Right breast cancer with poor venous access in  need of chemotherapy.   PROCEDURE:  Placement of left subclavian 8-French Power Port-A-Cath with  fluoroscopy.   SURGEON:  Maisie Fus A. Cornett, MD   ANESTHESIA:  MAC with 0.25% Sensorcaine local.   ESTIMATED BLOOD LOSS:  20 mL.   SPECIMEN:  None.   INDICATIONS FOR PROCEDURE:  The patient is a 50 year old female who was  recently diagnosed with right breast cancer.  She had a previous  mastectomy and is in need of central venous access for chemotherapy.  She presents today for Port-A-Cath placement for the above.  The risk of  procedure were discussed to include bleeding, infection, pneumothorax,  pericardial tamponade, mediastinal bleeding, esophageal or brachial  plexus injury, and blood vessel injury.  She understood the above and  agreed to proceed.   DESCRIPTION OF PROCEDURE:  The patient was brought to the operating room  and placed supine.  Both arms were tucked and the upper chest was  prepped and draped in a sterile fashion.  The left subclavian region was  chosen since she had a recent right mastectomy.  With the patient in  Trendelenburg, the left subclavian vein was cannulated easily.  Wire was  fed through this and needle was removed.  Fluoroscopy showed the wire to  course to the left subclavian vein into the innominate vein down into  superior vena cava.  We flattened the patient out.  Small incision below  the vein puncture site was created using a  scalpel to about 4 cm.  Dissection was carried down until the chest wall was encountered.  Small  pocket was made bluntly with my finger.  An 8-French power port catheter  was brought on the field, it was attached and flushed with heparinized  saline.  We then tunneled the catheter from the lower incision to the  upper incision and cut the catheter to be about 18-19 cm in length.  With the patient back in Trendelenburg, I then passed the dilator over  the wire moving the wire to-and-fro as advanced without resistance.  I  then placed a dilator introducer complex together, passed it over the  wire moving the wire to-and-fro as I advanced the complex.  This  advanced easily and I removed the dilator and wire from the central  portion of introducer.  Catheter was then placed in the introducer and  Peel-Away sheath was peeled away.  Fluoroscopy showed the tip to be in  the vena cava by fluoroscopy.  This appeared to be at the junction of  the right atrium and vena cava.  This drew back dark nonpulsatile blood  and flushed quite easily.  It was flushed with heparinized saline,  concentration 100  units/mL x5 mL.  We then closed the wound in layers with a deep layer of  3-0 Vicryl and 4-0 Monocryl subcuticular stitch.  Dermabond was applied.  All final counts of sponge, needle, and instruments were found to be  correct at this portion of case.  The patient was awoke and taken to  recovery in satisfactory condition.      Thomas A. Cornett, M.D.  Electronically Signed     TAC/MEDQ  D:  04/18/2008  T:  04/18/2008  Job:  [id number]   cc:   Drue Second, MD

## 2010-11-12 NOTE — H&P (Signed)
NAMESUVI, ARCHULETTA NO.:  [id number]   MEDICAL RECORD NO.:  [id number]          PATIENT TYPE:  AMB   LOCATION:  SDC                           FACILITY:  [facility]   PHYSICIAN:  Ginger Carne, MD  DATE OF BIRTH:  August 20, 1960   DATE OF ADMISSION:  04/15/2005  DATE OF DISCHARGE:                                HISTORY & PHYSICAL   CHIEF COMPLAINT:  Menometrorrhagia.   HISTORY OF PRESENT ILLNESS:  This patient is a 50 year old gravida 1, para 1-  0-0-1 Caucasian  female admitted for a total vaginal hysterectomy and  preservation of both tubes and ovaries.  The patient has had a 4 to 5-year  history of ongoing heavy menses lasting 7 to 9 days.  She has been on  various forms of oral contraceptives with modest benefit.  She complains of  significant dysmenorrhea with same.  She does not have complaints of  intramenstrual cramping, dyspareunia, or significant intramenstrual  spotting.  She states from time to time she does spot before her menses.  Transvaginal ultrasound revealed no anatomical abnormalities within the  intracavitary region.  The patient demonstrated no evidence of fibroids.  Endometrial biopsy revealed no evidence for neoplasia or hyperplasia.  The  patient takes no medication, has no bleeding propensity, and has no personal  family history of bleeding diaphysis.  The patient also demonstrates on  laboratory work no evidence of endocrinological abnormalities including  thyroid problems.   OB-GYN HISTORY:  The patient has had 1 cesarean section in 1996.   PAST MEDICAL HISTORY:  Negative.   PAST SURGICAL HISTORY:  Cholecystectomy in 2004.   ALLERGIES:  None.   CURRENT MEDICATIONS:  None.   SOCIAL HISTORY:  Negative for smoking, drug, or alcohol abuse.   FAMILY HISTORY:  Mother is in good health.  Her father is deceased for  carcinoma, uncertain of location.   REVIEW OF SYSTEMS:  Negative.   PHYSICAL EXAMINATION:  VITAL SIGNS:  Height 5 feet 7  inches.  Weight 179  pounds.  Blood pressure 110/78.  HEENT:  Grossly normal.  BREASTS:  Exam without mass, discharge, thickening, or tenderness.  CHEST: Clear to percussion and auscultation.  CARDIOVASCULAR;  Without murmurs or enlargement.  Regular rate and rhythm.  EXTREMITIES/SKIN/NEUROLOGIC/MUSCULOSKELETAL: Normal.  ABDOMEN:  Soft without gross hepatosplenomegaly.  PELVIC: Normal Pap smear.  External genitalia, vulva, vagina normal.  Cervix  smooth without erosions or lesions. Uterus normal in size.  Both adnexa  palpable and found to be normal.  RECTAL:  Hemoccult negative without masses.   IMPRESSION:  Menometrorrhagia.   PLAN:  The patient was provided the option of a Progestrone-bearing  intrauterine device and/or an endometrial NovaSure ablation technique.  She  has no desire for further childbearing and desires permanent relief from her  abnormal bleeding and dysmenorrhea.  A total vagina hysterectomy with  preservation of both tubes and ovaries were discussed and understood by  patient.  The nature of said procedure discussed in detail.  Risks including  injury to ureter, bowel, and bladder; possible conversion to an open or  laparoscopic  procedure; hemorrhage possibly requiring a blood transfusion;  infection; or pulmonary complications discussed and understood by said  patient.  All questions answered to satisfaction of said patient.      Ginger Carne, MD  Electronically Signed     SHB/MEDQ  D:  04/13/2005  T:  04/13/2005  Job:  [id number]

## 2010-11-12 NOTE — Op Note (Signed)
Tina Potter, Tina Potter NO.:  [id number]   MEDICAL RECORD NO.:  [id number]          PATIENT TYPE:  OBV   LOCATION:  9399                          FACILITY:  [facility]   PHYSICIAN:  Ginger Carne, MD  DATE OF BIRTH:  1960/08/25   DATE OF PROCEDURE:  04/15/2005  DATE OF DISCHARGE:                                 OPERATIVE REPORT   PREOPERATIVE DIAGNOSIS:  Menometrorrhagia.   POSTOPERATIVE DIAGNOSIS:  Menometrorrhagia.   PROCEDURE:  Total vaginal hysterectomy, preservation of both tubes and  ovaries.   SURGEON:  Ginger Carne, M.D.   ASSISTANT:  None.   COMPLICATIONS:  None immediate.   ESTIMATED BLOOD LOSS:  50 mL.   SPECIMENS:  Uterus and cervix.   ANESTHESIA:  General with Marcaine with epinephrine paracervical block.   FINDINGS:  The uterus was approximately 8 to 10 weeks in size with multiple  small leiomyoma.  Both tubes and ovaries appeared normal.   DESCRIPTION OF PROCEDURE:  The patient was prepped and draped in the usual  fashion and placed in the lithotomy position.  Betadine solution used for  antiseptic and the patient was catheterized prior to the procedure.  After  adequate general anesthesia a tenaculum was placed on the anterior and  posterior lips of the cervix.  28 mL of Marcaine with epinephrine was  injected circumferentially around the cervix.  2 cm of anterior and  posterior vaginal epithelium were incised transversely.  Peritoneal  reflection was identified and opened without injury to the respective  organs.  Uterosacral cardinal ligament complexes were clamped, cut, and  ligated with 0 Vicryl suture.  This extended to the uterine vasculature,  ascending branches of the uterine vessels, and broad ligaments.  The utero-  ovarian ligaments were clamped, cut, and ligated with transfixation 0 Vicryl  suture x2.  Bleeding points hemostatically checked.  Blood clots removed  from the abdomen.  Closure of the cuff in one layer was 0  Vicryl running  interlocking suture.  The patient tolerated the procedure well and returned  to the postanesthesia recovery room in excellent condition.     Ginger Carne, MD  Electronically Signed    SHB/MEDQ  D:  04/15/2005  T:  04/15/2005  Job:  [id number]

## 2010-11-12 NOTE — Discharge Summary (Signed)
Tina Potter, GARRAMONE                ACCOUNT NO.:  [id number]   MEDICAL RECORD NO.:  [id number]          PATIENT TYPE:  OBV   LOCATION:  9317                          [facility]ILITY:  [facility]   PHYSICIAN:  Ginger Carne, MD  DATE OF BIRTH:  01/12/1961   DATE OF ADMISSION:  04/15/2005  DATE OF DISCHARGE:  04/16/2005                                 DISCHARGE SUMMARY   REASON FOR HOSPITALIZATION:  Menometrorrhagia.   IN HOSPITAL PROCEDURES:  Total vaginal hysterectomy with preservation of  both tubes and ovaries.   FINAL DIAGNOSIS:  Menometrorrhagia.   HOSPITAL COURSE:  This is a 49 year old Caucasian female admitted for the  aforementioned procedure because of a long-standing history of  menometrorrhagia. The patient was appropriately worked up prior to her  surgery. Intraoperative course uneventful. Postoperative course  uncomplicated. Hemoglobin 11.3, hematocrit 33.7. She was afebrile, vital  signs were stable, abdomen was soft, calves without tenderness bilaterally.  Lungs clear. No vaginal bleeding and she voided well.   Routine discharge instructions provided including contacting the office for  temperature elevation above 100.4 degrees Fahrenheit, increasing abdominal  pain, difficulty with voiding or constipation or vaginal bleeding.   Percocet 5/325 1-2 q.4-6h p.r.n. prescribed. Citrucel for constipation as  needed, and Zofran 8 mg 1 q.6h p.r.n. for nausea and vomiting.   She will return in 4 weeks for her postoperative check up.      Ginger Carne, MD  Electronically Signed     SHB/MEDQ  D:  04/16/2005  T:  04/17/2005  Job:  [id number]

## 2011-01-13 ENCOUNTER — Encounter (HOSPITAL_BASED_OUTPATIENT_CLINIC_OR_DEPARTMENT_OTHER): Payer: BC Managed Care – PPO | Admitting: Oncology

## 2011-01-13 ENCOUNTER — Other Ambulatory Visit: Payer: Self-pay | Admitting: Oncology

## 2011-01-13 DIAGNOSIS — C50919 Malignant neoplasm of unspecified site of unspecified female breast: Secondary | ICD-10-CM

## 2011-01-13 LAB — CBC WITH DIFFERENTIAL/PLATELET
Basophils Absolute: 0 10*3/uL (ref 0.0–0.1)
Eosinophils Absolute: 0.4 10*3/uL (ref 0.0–0.5)
HGB: 14.1 g/dL (ref 11.6–15.9)
MONO#: 0.6 10*3/uL (ref 0.1–0.9)
NEUT#: 3.6 10*3/uL (ref 1.5–6.5)
RBC: 4.55 10*6/uL (ref 3.70–5.45)
RDW: 12.9 % (ref 11.2–14.5)
WBC: 6.6 10*3/uL (ref 3.9–10.3)
lymph#: 2 10*3/uL (ref 0.9–3.3)

## 2011-01-13 LAB — COMPREHENSIVE METABOLIC PANEL
AST: 18 U/L (ref 0–37)
Alkaline Phosphatase: 74 U/L (ref 39–117)
BUN: 14 mg/dL (ref 6–23)
Glucose, Bld: 96 mg/dL (ref 70–99)
Sodium: 140 mEq/L (ref 135–145)
Total Bilirubin: 0.5 mg/dL (ref 0.3–1.2)
Total Protein: 7.7 g/dL (ref 6.0–8.3)

## 2011-03-02 ENCOUNTER — Other Ambulatory Visit: Payer: Self-pay | Admitting: Oncology

## 2011-03-02 DIAGNOSIS — Z1231 Encounter for screening mammogram for malignant neoplasm of breast: Secondary | ICD-10-CM

## 2011-03-28 LAB — BASIC METABOLIC PANEL
BUN: 10
CO2: 25
Chloride: 108
Potassium: 4.2

## 2011-03-28 LAB — CBC
HCT: 41.9
MCHC: 34.1
MCV: 91.6
Platelets: 288
RBC: 4.57
WBC: 8.1

## 2011-03-28 LAB — DIFFERENTIAL
Lymphocytes Relative: 28
Lymphs Abs: 2.2
Monocytes Absolute: 0.6
Monocytes Relative: 8
Neutro Abs: 5

## 2011-03-29 LAB — CBC
HCT: 38.2
Hemoglobin: 12.7
MCHC: 33.4
MCV: 93.3
RDW: 12.5

## 2011-04-11 ENCOUNTER — Ambulatory Visit
Admission: RE | Admit: 2011-04-11 | Discharge: 2011-04-11 | Disposition: A | Discharge status: Home / Self Care | Payer: BC Managed Care – PPO | Source: Ambulatory Visit | Attending: Oncology | Admitting: Oncology

## 2011-04-11 DIAGNOSIS — Z1231 Encounter for screening mammogram for malignant neoplasm of breast: Secondary | ICD-10-CM

## 2011-06-01 ENCOUNTER — Telehealth: Payer: Self-pay | Admitting: Oncology

## 2011-06-01 NOTE — Telephone Encounter (Signed)
per pof 07/19 called pts home lmovm with appts for feb2013.  asked pt to rtn call to confirm appts

## 2011-06-02 ENCOUNTER — Telehealth: Payer: Self-pay | Admitting: Oncology

## 2011-06-02 NOTE — Telephone Encounter (Signed)
pt rtn call and confirmed appts for [phone]

## 2011-08-03 ENCOUNTER — Other Ambulatory Visit: Payer: Self-pay | Admitting: *Deleted

## 2011-08-03 DIAGNOSIS — C50919 Malignant neoplasm of unspecified site of unspecified female breast: Secondary | ICD-10-CM

## 2011-08-03 DIAGNOSIS — C50119 Malignant neoplasm of central portion of unspecified female breast: Secondary | ICD-10-CM

## 2011-08-03 MED ORDER — LETROZOLE 2.5 MG PO TABS: 2.5000 mg | ORAL_TABLET | Freq: Every day | ORAL | Status: AC

## 2011-08-18 ENCOUNTER — Other Ambulatory Visit (HOSPITAL_BASED_OUTPATIENT_CLINIC_OR_DEPARTMENT_OTHER): Payer: BC Managed Care – PPO | Admitting: Lab

## 2011-08-18 ENCOUNTER — Telehealth: Payer: Self-pay | Admitting: Oncology

## 2011-08-18 ENCOUNTER — Ambulatory Visit (HOSPITAL_BASED_OUTPATIENT_CLINIC_OR_DEPARTMENT_OTHER): Payer: BC Managed Care – PPO | Admitting: Oncology

## 2011-08-18 ENCOUNTER — Encounter: Payer: Self-pay | Admitting: Oncology

## 2011-08-18 DIAGNOSIS — C50511 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 female breast: Secondary | ICD-10-CM | POA: Insufficient documentation

## 2011-08-18 DIAGNOSIS — C50919 Malignant neoplasm of unspecified site of unspecified female breast: Secondary | ICD-10-CM

## 2011-08-18 DIAGNOSIS — G4700 Insomnia, unspecified: Secondary | ICD-10-CM

## 2011-08-18 HISTORY — DX: Malignant neoplasm of unspecified site of unspecified female breast: C50.919

## 2011-08-18 LAB — CBC WITH DIFFERENTIAL/PLATELET
BASO%: 0.6 % (ref 0.0–2.0)
HCT: 38.1 % (ref 34.8–46.6)
LYMPH%: 29.6 % (ref 14.0–49.7)
MCHC: 34.5 g/dL (ref 31.5–36.0)
MCV: 90.5 fL (ref 79.5–101.0)
MONO%: 7.6 % (ref 0.0–14.0)
NEUT%: 59.3 % (ref 38.4–76.8)
Platelets: 248 10*3/uL (ref 145–400)
RBC: 4.21 10*6/uL (ref 3.70–5.45)

## 2011-08-18 MED ORDER — ZOLPIDEM TARTRATE 5 MG PO TABS: 5.0000 mg | ORAL_TABLET | Freq: Every evening | ORAL | Status: DC | PRN

## 2011-08-18 MED ORDER — LETROZOLE 2.5 MG PO TABS: 2.5000 mg | ORAL_TABLET | Freq: Every day | ORAL | Status: AC

## 2011-08-18 NOTE — Telephone Encounter (Signed)
gve the pt her aug 2013 appt calendar 

## 2011-08-18 NOTE — Progress Notes (Signed)
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  PRIOR THERAPY:  #1 status post right modified radical mastectomy with excellent lymph node dissection for a stage IIB right breast cancer in September 2009.  #2 adjuvant chemotherapy consisting of Taxotere Adriamycin and Cytoxan for a total of 6 cycles between 04/22/2008 to 08/05/2008.  #3 patient underwent postmastectomy radiation from 10/02/2008 2 10/15/2008  #4 tamoxifen 20 mg daily for Nov 04 2008 12/26/2008 but this was discontinued.  #5 01/07/2009 patient underwent bilateral salpingo-oophorectomies.  #6 patient was then begun on letrozole 2.5 mg daily starting 01/14/2009.  #7 05/03/2009 patient underwent right tram flap breast reconstruction  #8 patient has had BRCA1 and 2 testing and she was negative for the genes.  CURRENT THERAPY:Letrozole 2.5 mg daily since July 2010  INTERVAL HISTORY: Tina Potter 51 y.o. female returns for Followup visit. Clinically she seems to be doing well she does continue to have right upper extremity lymphedema she does wear a lymphedema sleeve. She has been having some difficulty sleeping and she is requesting Ambien prescription. She otherwise denies any nausea vomiting fevers chills night sweats headaches shortness of breath chest pains palpitations she has no visual disturbances she denies any back pain or other aches or pains. She has no hematuria hematochezia melena hemoptysis or hematemesis. She denies any peripheral paresthesias no difficulty in ambulation. She has no changes in her bowel or bladder habits. Remainder of the 10 point review of systems is negative.  MEDICAL HISTORY: Past Medical History  Diagnosis Date  . Breast cancer   . Lymphedema of arm   . Breast cancer 08/18/2011    ALLERGIES:   has no known  allergies.  MEDICATIONS:  Current Outpatient Prescriptions  Medication Sig Dispense Refill  . letrozole (FEMARA) 2.5 MG tablet Take 1 tablet (2.5 mg total) by mouth daily.  30 tablet  1  . letrozole (FEMARA) 2.5 MG tablet Take 1 tablet (2.5 mg total) by mouth daily.  90 tablet  12  . zolpidem (AMBIEN) 5 MG tablet Take 1 tablet (5 mg total) by mouth at bedtime as needed for sleep.  30 tablet  0    SURGICAL HISTORY:  Past Surgical History  Procedure Date  . Mastectomy, modified radical w/reconstruction 01/2008  . Bilateral oophorectomy 12/2008    REVIEW OF SYSTEMS:  Pertinent items are noted in HPI.   PHYSICAL EXAMINATION: General appearance: alert, cooperative and appears stated age Head: Normocephalic, without obvious abnormality, atraumatic Neck: no adenopathy, no carotid bruit, no JVD, supple, symmetrical, trachea midline and thyroid not enlarged, symmetric, no tenderness/mass/nodules Lymph nodes: Cervical, supraclavicular, and axillary nodes normal. Resp: clear to auscultation bilaterally and normal percussion bilaterally Back: symmetric, no curvature. ROM normal. No CVA tenderness. Cardio: regular rate and rhythm, S1, S2 normal, no murmur, click, rub or gallop and normal apical impulse GI: soft, non-tender; bowel sounds normal; no masses,  no organomegaly Extremities: edema right upper extremity edema is noted with a lymphedema sleeve. There is no bilateral lower extremity edema cyanosis or clubbing Neurologic: Alert and oriented X 3, normal strength and tone. Normal symmetric reflexes. Normal coordination and gait Bilateral breast examination right is a constricting breast there is no skin changes there is surgical scars without any nodularity. Left breast no masses nipple discharge retraction or inversion. ECOG PERFORMANCE STATUS: 0 - Asymptomatic  Blood pressure 110/73, pulse 81,  temperature 98.3 F (36.8 C), height 5\' 7"  (1.702 m), weight 168 lb 6.4 oz (76.386  kg).  LABORATORY DATA: Lab Results  Component Value Date   WBC 7.2 08/18/2011   HGB 13.1 08/18/2011   HCT 38.1 08/18/2011   MCV 90.5 08/18/2011   PLT 248 08/18/2011      Chemistry      Component Value Date/Time   NA 140 01/13/2011 0834   NA 133 01/13/2010 1434   K 3.9 01/13/2011 0834   K 3.9 01/13/2010 1434   CL 102 01/13/2011 0834   CL 99 01/13/2010 1434   CO2 24 01/13/2011 0834   CO2 27 01/13/2010 1434   BUN 14 01/13/2011 0834   BUN 12 01/13/2010 1434   CREATININE 0.61 01/13/2011 0834   CREATININE 0.8 01/13/2010 1434      Component Value Date/Time   CALCIUM 10.0 01/13/2011 0834   CALCIUM 9.4 01/13/2010 1434   ALKPHOS 74 01/13/2011 0834   ALKPHOS 79 01/13/2010 1434   AST 18 01/13/2011 0834   AST 24 01/13/2010 1434   ALT 18 01/13/2011 0834   BILITOT 0.5 01/13/2011 0834   BILITOT 0.70 01/13/2010 1434       RADIOGRAPHIC STUDIES:  No results found.  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 Patient then went through adjuvant chemotherapy consisting of 6 cycles of TAC. She tolerated this well.  #2 she then underwent postmastectomy radiation therapy.  #3 she is status post bilateral salpingo-oophorectomies.  #4 patient is now on letrozole 2.5 mg daily.  #5 patient does have insomnia.   PLAN:   #1 patient will continue letrozole 2.5 mg daily a prescription was given to her.  #2 for insomnia patient was given a prescription for Ambien 5 mg #30 no refills.  #3 patient was suggested to get a primary care physician for ongoing medical needs.  #4 she will be seen back in 6 months time for followup with Colman Cater.   All questions were answered. The patient knows to call the clinic with any problems, questions or concerns. We can certainly see the patient much sooner if necessary.  I spent 30 minutes counseling the patient face to face. The total  time spent in the appointment was 30 minutes.    Tina Second, MD Medical/Oncology [facility] [phone] (beeper) [phone] (Office)  08/18/2011, 11:06 AM

## 2011-08-19 LAB — COMPREHENSIVE METABOLIC PANEL
ALT: 16 U/L (ref 0–35)
Alkaline Phosphatase: 60 U/L (ref 39–117)
Creatinine, Ser: 0.6 mg/dL (ref 0.50–1.10)
Glucose, Bld: 87 mg/dL (ref 70–99)
Sodium: 141 mEq/L (ref 135–145)
Total Bilirubin: 0.4 mg/dL (ref 0.3–1.2)
Total Protein: 7.1 g/dL (ref 6.0–8.3)

## 2011-08-19 LAB — VITAMIN D 25 HYDROXY (VIT D DEFICIENCY, FRACTURES): Vit D, 25-Hydroxy: 36 ng/mL (ref 30–89)

## 2012-02-21 ENCOUNTER — Encounter: Payer: Self-pay | Admitting: Oncology

## 2012-02-21 ENCOUNTER — Other Ambulatory Visit (HOSPITAL_BASED_OUTPATIENT_CLINIC_OR_DEPARTMENT_OTHER): Payer: BC Managed Care – PPO | Admitting: Lab

## 2012-02-21 ENCOUNTER — Ambulatory Visit (HOSPITAL_BASED_OUTPATIENT_CLINIC_OR_DEPARTMENT_OTHER): Payer: BC Managed Care – PPO | Admitting: Oncology

## 2012-02-21 VITALS — BP 120/74 | HR 67 | Temp 98.5°F | Resp 20 | Ht 67.0 in | Wt 163.6 lb

## 2012-02-21 DIAGNOSIS — G4700 Insomnia, unspecified: Secondary | ICD-10-CM

## 2012-02-21 DIAGNOSIS — C50919 Malignant neoplasm of unspecified site of unspecified female breast: Secondary | ICD-10-CM

## 2012-02-21 LAB — CBC WITH DIFFERENTIAL/PLATELET
BASO%: 0.6 % (ref 0.0–2.0)
Basophils Absolute: 0 10*3/uL (ref 0.0–0.1)
HCT: 38.1 % (ref 34.8–46.6)
HGB: 13.2 g/dL (ref 11.6–15.9)
LYMPH%: 31.5 % (ref 14.0–49.7)
MCHC: 34.7 g/dL (ref 31.5–36.0)
MONO#: 0.6 10*3/uL (ref 0.1–0.9)
NEUT%: 54.7 % (ref 38.4–76.8)
Platelets: 225 10*3/uL (ref 145–400)
WBC: 6.9 10*3/uL (ref 3.9–10.3)
lymph#: 2.2 10*3/uL (ref 0.9–3.3)

## 2012-02-21 LAB — COMPREHENSIVE METABOLIC PANEL (CC13)
AST: 18 U/L (ref 5–34)
Albumin: 4.1 g/dL (ref 3.5–5.0)
BUN: 19 mg/dL (ref 7.0–26.0)
CO2: 26 mEq/L (ref 22–29)
Calcium: 9.7 mg/dL (ref 8.4–10.4)
Chloride: 105 mEq/L (ref 98–107)
Creatinine: 0.7 mg/dL (ref 0.6–1.1)
Glucose: 83 mg/dl (ref 70–99)
Potassium: 4.3 mEq/L (ref 3.5–5.1)

## 2012-02-21 MED ORDER — VENLAFAXINE HCL ER 37.5 MG PO CP24: 37.5000 mg | ORAL_CAPSULE | Freq: Every day | ORAL | Status: DC

## 2012-02-21 NOTE — Progress Notes (Signed)
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  PRIOR THERAPY:  #1 status post right modified radical mastectomy with excellent lymph node dissection for a stage IIB right breast cancer in September 2009.  #2 adjuvant chemotherapy consisting of Taxotere Adriamycin and Cytoxan for a total of 6 cycles between 04/22/2008 to 08/05/2008.  #3 patient underwent postmastectomy radiation from 10/02/2008 2 10/15/2008  #4 tamoxifen 20 mg daily for Nov 04 2008 12/26/2008 but this was discontinued.  #5 01/07/2009 patient underwent bilateral salpingo-oophorectomies.  #6 patient was then begun on letrozole 2.5 mg daily starting 01/14/2009.  #7 05/03/2009 patient underwent right tram flap breast reconstruction  #8 patient has had BRCA1 and 2 testing and she was negative for the genes.  CURRENT THERAPY:Letrozole 2.5 mg daily since July 2010  INTERVAL HISTORY: Tina Potter 51 y.o. female returns for Followup visit.She is accompanied by her husband overall she is doing well she is tolerating letrozole without any significant problems. She denies any fevers chills night sweats headaches shortness of breath chest pains palpitations no myalgias or arthralgias. She continues to work a lymphedema sleeve in the right arm. She has no back pain no vaginal discharge or bleeding. Remainder of the 10 pointe review of systems is negative. MEDICAL HISTORY: Past Medical History  Diagnosis Date  . Breast cancer   . Lymphedema of arm   . Breast cancer 08/18/2011    ALLERGIES:   has no known allergies.  MEDICATIONS:  Current Outpatient Prescriptions  Medication Sig Dispense Refill  . letrozole (FEMARA) 2.5 MG tablet Take 2.5 mg by mouth daily.      Marland Kitchen zolpidem (AMBIEN) 5 MG tablet Take 1 tablet (5 mg total) by mouth at bedtime as needed for sleep.  30 tablet  0    SURGICAL HISTORY:    Past Surgical History  Procedure Date  . Mastectomy, modified radical w/reconstruction 01/2008  . Bilateral oophorectomy 12/2008    REVIEW OF SYSTEMS:  Pertinent items are noted in HPI.   PHYSICAL EXAMINATION: General appearance: alert, cooperative and appears stated age Head: Normocephalic, without obvious abnormality, atraumatic Neck: no adenopathy, no carotid bruit, no JVD, supple, symmetrical, trachea midline and thyroid not enlarged, symmetric, no tenderness/mass/nodules Lymph nodes: Cervical, supraclavicular, and axillary nodes normal. Resp: clear to auscultation bilaterally and normal percussion bilaterally Back: symmetric, no curvature. ROM normal. No CVA tenderness. Cardio: regular rate and rhythm, S1, S2 normal, no murmur, click, rub or gallop and normal apical impulse GI: soft, non-tender; bowel sounds normal; no masses,  no organomegaly Extremities: edema right upper extremity edema is noted with a lymphedema sleeve. There is no bilateral lower extremity edema cyanosis or clubbing Neurologic: Alert and oriented X 3, normal strength and tone. Normal symmetric reflexes. Normal coordination and gait Bilateral breast examination right Reconstructed breast there is no skin changes there is surgical scars without any nodularity. Left breast no masses nipple discharge retraction or inversion. ECOG PERFORMANCE STATUS: 0 - Asymptomatic  Blood pressure 120/74, pulse 67, temperature 98.5 F (36.9 C), temperature source Oral, resp. rate 20, height 5\' 7"  (1.702 m), weight 163 lb 9.6 oz (74.208 kg).  LABORATORY DATA: Lab Results  Component Value Date   WBC 6.9 02/21/2012   HGB 13.2 02/21/2012   HCT 38.1 02/21/2012   MCV 90.5 02/21/2012   PLT 225 02/21/2012      Chemistry      Component  Value Date/Time   NA 141 08/18/2011 0949   NA 133 01/13/2010 1434   K 4.2 08/18/2011 0949   K 3.9 01/13/2010 1434   CL 107 08/18/2011 0949   CL 99 01/13/2010 1434   CO2 24 08/18/2011 0949   CO2 27  01/13/2010 1434   BUN 16 08/18/2011 0949   BUN 12 01/13/2010 1434   CREATININE 0.60 08/18/2011 0949   CREATININE 0.8 01/13/2010 1434      Component Value Date/Time   CALCIUM 9.8 08/18/2011 0949   CALCIUM 9.4 01/13/2010 1434   ALKPHOS 60 08/18/2011 0949   ALKPHOS 79 01/13/2010 1434   AST 18 08/18/2011 0949   AST 24 01/13/2010 1434   ALT 16 08/18/2011 0949   BILITOT 0.4 08/18/2011 0949   BILITOT 0.70 01/13/2010 1434       RADIOGRAPHIC STUDIES:  No results found.  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 Patient then went through adjuvant chemotherapy consisting of 6 cycles of TAC. She tolerated this well.  #2 she then underwent postmastectomy radiation therapy.  #3 she is status post bilateral salpingo-oophorectomies.  #4 patient is now on letrozole 2.5 mg daily.  #5 patient does have insomnia She is on Ambien as needed.  PLAN:  #1 overall patient is doing well I will continue to see her every 6 months. She will continue letrozole 2.5 mg daily. All questions were answered. The patient knows to call the clinic with any problems, questions or concerns. We can certainly see the patient much sooner if necessary.  I spent 30 minutes counseling the patient face to face. The total time spent in the appointment was 30 minutes.    Drue Second, MD Medical/Oncology [facility] [phone] (beeper) [phone] (Office)  02/21/2012, 11:14 AM

## 2012-02-21 NOTE — Patient Instructions (Addendum)
Continue with letrozole  Effexor for hot flashes  peridin C for hot flashes

## 2012-02-22 ENCOUNTER — Telehealth: Payer: Self-pay | Admitting: *Deleted

## 2012-02-22 NOTE — Telephone Encounter (Signed)
OUT CALENDAR TO INFORM THE PATIENT OF THE NEW DATE AND TIME 07-2012

## 2012-02-24 ENCOUNTER — Telehealth: Payer: Self-pay | Admitting: Medical Oncology

## 2012-02-24 NOTE — Telephone Encounter (Signed)
LMOVM to return call to clinic.

## 2012-02-24 NOTE — Telephone Encounter (Signed)
Spoke with patient, per MD, patient's vitamin D level looked good and MD suggests taking vitamin D3 1000 units daily.  Patient expressed understanding and had no questions at this time.

## 2012-02-24 NOTE — Telephone Encounter (Signed)
Message copied by Tylene Fantasia on Fri Feb 24, 2012  9:43 AM ------      Message from: Victorino December      Created: Thu Feb 23, 2012  6:16 PM       Please call patient: vitamin d levels good, take vitamin D3 1000 iu daily

## 2012-04-04 ENCOUNTER — Other Ambulatory Visit: Payer: Self-pay | Admitting: Oncology

## 2012-04-04 DIAGNOSIS — Z1231 Encounter for screening mammogram for malignant neoplasm of breast: Secondary | ICD-10-CM

## 2012-05-01 ENCOUNTER — Ambulatory Visit
Admission: RE | Admit: 2012-05-01 | Discharge: 2012-05-01 | Disposition: A | Discharge status: Home / Self Care | Payer: BC Managed Care – PPO | Source: Ambulatory Visit | Attending: Oncology | Admitting: Oncology

## 2012-05-01 DIAGNOSIS — Z1231 Encounter for screening mammogram for malignant neoplasm of breast: Secondary | ICD-10-CM

## 2012-07-30 ENCOUNTER — Encounter: Payer: Self-pay | Admitting: Oncology

## 2012-07-30 ENCOUNTER — Telehealth: Payer: Self-pay | Admitting: Oncology

## 2012-07-30 ENCOUNTER — Other Ambulatory Visit (HOSPITAL_BASED_OUTPATIENT_CLINIC_OR_DEPARTMENT_OTHER): Payer: BC Managed Care – PPO | Admitting: Lab

## 2012-07-30 ENCOUNTER — Ambulatory Visit (HOSPITAL_BASED_OUTPATIENT_CLINIC_OR_DEPARTMENT_OTHER): Payer: BC Managed Care – PPO | Admitting: Oncology

## 2012-07-30 VITALS — BP 116/72 | HR 80 | Temp 98.6°F | Resp 20 | Ht 67.0 in | Wt 162.0 lb

## 2012-07-30 DIAGNOSIS — C50919 Malignant neoplasm of unspecified site of unspecified female breast: Secondary | ICD-10-CM

## 2012-07-30 DIAGNOSIS — G4700 Insomnia, unspecified: Secondary | ICD-10-CM

## 2012-07-30 LAB — CBC WITH DIFFERENTIAL/PLATELET
Basophils Absolute: 0 10*3/uL (ref 0.0–0.1)
EOS%: 5.3 % (ref 0.0–7.0)
HCT: 39.4 % (ref 34.8–46.6)
HGB: 13.4 g/dL (ref 11.6–15.9)
MCH: 30.7 pg (ref 25.1–34.0)
MONO#: 0.8 10*3/uL (ref 0.1–0.9)
NEUT#: 4.2 10*3/uL (ref 1.5–6.5)
NEUT%: 54.2 % (ref 38.4–76.8)
RDW: 12.8 % (ref 11.2–14.5)
WBC: 7.8 10*3/uL (ref 3.9–10.3)
lymph#: 2.4 10*3/uL (ref 0.9–3.3)

## 2012-07-30 LAB — COMPREHENSIVE METABOLIC PANEL (CC13)
ALT: 25 U/L (ref 0–55)
AST: 19 U/L (ref 5–34)
Albumin: 3.7 g/dL (ref 3.5–5.0)
BUN: 15.5 mg/dL (ref 7.0–26.0)
CO2: 26 mEq/L (ref 22–29)
Calcium: 9.4 mg/dL (ref 8.4–10.4)
Chloride: 105 mEq/L (ref 98–107)
Creatinine: 0.7 mg/dL (ref 0.6–1.1)
Potassium: 4 mEq/L (ref 3.5–5.1)

## 2012-07-30 MED ORDER — VENLAFAXINE HCL ER 37.5 MG PO CP24: 37.5000 mg | ORAL_CAPSULE | Freq: Every day | ORAL | Status: DC

## 2012-07-30 MED ORDER — LETROZOLE 2.5 MG PO TABS: 2.5000 mg | ORAL_TABLET | Freq: Every day | ORAL | Status: DC

## 2012-07-30 NOTE — Patient Instructions (Addendum)
Continue letrozole and effexor  We will see you back in 6 months

## 2012-07-30 NOTE — Progress Notes (Signed)
OFFICE PROGRESS NOTE  CC  [name], MD 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  PRIOR THERAPY:  #1 status post right modified radical mastectomy with excellent lymph node dissection for a stage IIB right breast cancer in September 2009.  #2 adjuvant chemotherapy consisting of Taxotere Adriamycin and Cytoxan for a total of 6 cycles between 04/22/2008 to 08/05/2008.  #3 patient underwent postmastectomy radiation from 10/02/2008 2 10/15/2008  #4 tamoxifen 20 mg daily for Nov 04 2008 12/26/2008 but this was discontinued.  #5 01/07/2009 patient underwent bilateral salpingo-oophorectomies.  #6 patient was then begun on letrozole 2.5 mg daily starting 01/14/2009.  #7 05/03/2009 patient underwent right tram flap breast reconstruction  #8 patient has had BRCA1 and 2 testing and she was negative for the genes.  CURRENT THERAPY:Letrozole 2.5 mg daily since July 2010  INTERVAL HISTORY: Tina Potter 52 y.o. female returns for Followup visit. overall she is doing well she is tolerating letrozole without any significant problems. She denies any fevers chills night sweats headaches shortness of breath chest pains palpitations no myalgias or arthralgias. She continues to work a lymphedema sleeve in the right arm. She has no back pain no vaginal discharge or bleeding. Remainder of the 10 pointe review of systems is negative. MEDICAL HISTORY: Past Medical History  Diagnosis Date  . Breast cancer   . Lymphedema of arm   . Breast cancer 08/18/2011    ALLERGIES:   has no known allergies.  MEDICATIONS:  Current Outpatient Prescriptions  Medication Sig Dispense Refill  . letrozole (FEMARA) 2.5 MG tablet Take 2.5 mg by mouth daily.      Marland Kitchen venlafaxine XR (EFFEXOR-XR) 37.5 MG 24 hr capsule Take 1 capsule (37.5 mg total) by mouth daily.  30 capsule  12  . zolpidem (AMBIEN) 5 MG tablet Take 1 tablet (5 mg  total) by mouth at bedtime as needed for sleep.  30 tablet  0    SURGICAL HISTORY:  Past Surgical History  Procedure Date  . Mastectomy, modified radical w/reconstruction 01/2008  . Bilateral oophorectomy 12/2008    REVIEW OF SYSTEMS:  Pertinent items are noted in HPI.   PHYSICAL EXAMINATION: General appearance: alert, cooperative and appears stated age Head: Normocephalic, without obvious abnormality, atraumatic Neck: no adenopathy, no carotid bruit, no JVD, supple, symmetrical, trachea midline and thyroid not enlarged, symmetric, no tenderness/mass/nodules Lymph nodes: Cervical, supraclavicular, and axillary nodes normal. Resp: clear to auscultation bilaterally and normal percussion bilaterally Back: symmetric, no curvature. ROM normal. No CVA tenderness. Cardio: regular rate and rhythm, S1, S2 normal, no murmur, click, rub or gallop and normal apical impulse GI: soft, non-tender; bowel sounds normal; no masses,  no organomegaly Extremities: edema right upper extremity edema is noted with a lymphedema sleeve. There is no bilateral lower extremity edema cyanosis or clubbing Neurologic: Alert and oriented X 3, normal strength and tone. Normal symmetric reflexes. Normal coordination and gait Bilateral breast examination right Reconstructed breast there is no skin changes there is surgical scars without any nodularity. Left breast no masses nipple discharge retraction or inversion. ECOG PERFORMANCE STATUS: 0 - Asymptomatic  Blood pressure 116/72, pulse 80, temperature 98.6 F (37 C), temperature source Oral, resp. rate 20, height 5\' 7"  (1.702 m), weight 162 lb (73.483 kg).  LABORATORY DATA: Lab Results  Component Value Date   WBC 7.8 07/30/2012   HGB 13.4 07/30/2012   HCT 39.4 07/30/2012   MCV 90.7 07/30/2012  PLT 214 07/30/2012      Chemistry      Component Value Date/Time   NA 141 07/30/2012 0921   NA 141 08/18/2011 0949   NA 133 01/13/2010 1434   K 4.0 07/30/2012 0921   K 4.2 08/18/2011  0949   K 3.9 01/13/2010 1434   CL 105 07/30/2012 0921   CL 107 08/18/2011 0949   CL 99 01/13/2010 1434   CO2 26 07/30/2012 0921   CO2 24 08/18/2011 0949   CO2 27 01/13/2010 1434   BUN 15.5 07/30/2012 0921   BUN 16 08/18/2011 0949   BUN 12 01/13/2010 1434   CREATININE 0.7 07/30/2012 0921   CREATININE 0.60 08/18/2011 0949   CREATININE 0.8 01/13/2010 1434      Component Value Date/Time   CALCIUM 9.4 07/30/2012 0921   CALCIUM 9.8 08/18/2011 0949   CALCIUM 9.4 01/13/2010 1434   ALKPHOS 51 07/30/2012 0921   ALKPHOS 60 08/18/2011 0949   ALKPHOS 79 01/13/2010 1434   AST 19 07/30/2012 0921   AST 18 08/18/2011 0949   AST 24 01/13/2010 1434   ALT 25 07/30/2012 0921   ALT 16 08/18/2011 0949   BILITOT 0.24 07/30/2012 0921   BILITOT 0.4 08/18/2011 0949   BILITOT 0.70 01/13/2010 1434       RADIOGRAPHIC STUDIES:  No results found.  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 Patient then went through adjuvant chemotherapy consisting of 6 cycles of TAC. She tolerated this well.  #2 she then underwent postmastectomy radiation therapy.  #3 she is status post bilateral salpingo-oophorectomies.  #4 patient is now on letrozole 2.5 mg daily.  #5 patient does have insomnia She is on Ambien as needed.  PLAN:  #1 overall patient is doing well I will continue to see her every 6 months. She will continue letrozole 2.5 mg daily.  All questions were answered. The patient knows to call the clinic with any problems, questions or concerns. We can certainly see the patient much sooner if necessary.  I spent 30 minutes counseling the patient face to face. The total time spent in the appointment was 30 minutes.    Drue Second, MD Medical/Oncology [facility] [phone] (beeper) [phone] (Office)  07/30/2012, 10:05 AM

## 2012-07-30 NOTE — Telephone Encounter (Signed)
gv pt appt schedule for August.  °

## 2013-01-31 ENCOUNTER — Encounter: Payer: Self-pay | Admitting: Oncology

## 2013-01-31 ENCOUNTER — Ambulatory Visit (HOSPITAL_BASED_OUTPATIENT_CLINIC_OR_DEPARTMENT_OTHER): Payer: BC Managed Care – PPO | Admitting: Oncology

## 2013-01-31 ENCOUNTER — Other Ambulatory Visit (HOSPITAL_BASED_OUTPATIENT_CLINIC_OR_DEPARTMENT_OTHER): Payer: BC Managed Care – PPO

## 2013-01-31 ENCOUNTER — Telehealth: Payer: Self-pay | Admitting: *Deleted

## 2013-01-31 VITALS — BP 115/73 | HR 85 | Temp 98.5°F | Resp 18 | Ht 67.0 in | Wt 167.2 lb

## 2013-01-31 DIAGNOSIS — C50919 Malignant neoplasm of unspecified site of unspecified female breast: Secondary | ICD-10-CM

## 2013-01-31 DIAGNOSIS — C50911 Malignant neoplasm of unspecified site of right female breast: Secondary | ICD-10-CM

## 2013-01-31 LAB — COMPREHENSIVE METABOLIC PANEL (CC13)
ALT: 18 U/L (ref 0–55)
AST: 23 U/L (ref 5–34)
Albumin: 3.9 g/dL (ref 3.5–5.0)
CO2: 28 mEq/L (ref 22–29)
Calcium: 10 mg/dL (ref 8.4–10.4)
Chloride: 105 mEq/L (ref 98–109)
Potassium: 4 mEq/L (ref 3.5–5.1)
Total Protein: 7.6 g/dL (ref 6.4–8.3)

## 2013-01-31 LAB — CBC WITH DIFFERENTIAL/PLATELET
BASO%: 0.7 % (ref 0.0–2.0)
EOS%: 3.6 % (ref 0.0–7.0)
HGB: 12.8 g/dL (ref 11.6–15.9)
MCH: 30.9 pg (ref 25.1–34.0)
MCHC: 33.9 g/dL (ref 31.5–36.0)
MONO#: 0.6 10*3/uL (ref 0.1–0.9)
RDW: 12.7 % (ref 11.2–14.5)
WBC: 7.1 10*3/uL (ref 3.9–10.3)
lymph#: 2.4 10*3/uL (ref 0.9–3.3)

## 2013-01-31 NOTE — Telephone Encounter (Signed)
Lm gv appt d/t for 08/05/13 w/labs @ 2:15pm and ov @2 :45pm. Made the pt aware that i will mail a letter/avs as well...td

## 2013-02-07 ENCOUNTER — Other Ambulatory Visit: Payer: BC Managed Care – PPO | Admitting: Lab

## 2013-02-07 ENCOUNTER — Ambulatory Visit: Payer: BC Managed Care – PPO | Admitting: Oncology

## 2013-02-18 NOTE — Progress Notes (Signed)
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  PRIOR THERAPY:  #1 status post right modified radical mastectomy with excellent lymph node dissection for a stage IIB right breast cancer in September 2009.  #2 adjuvant chemotherapy consisting of Taxotere Adriamycin and Cytoxan for a total of 6 cycles between 04/22/2008 to 08/05/2008.  #3 patient underwent postmastectomy radiation from 10/02/2008 2 10/15/2008  #4 tamoxifen 20 mg daily for Nov 04 2008 12/26/2008 but this was discontinued.  #5 01/07/2009 patient underwent bilateral salpingo-oophorectomies.  #6 patient was then begun on letrozole 2.5 mg daily starting 01/14/2009.  #7 05/03/2009 patient underwent right tram flap breast reconstruction  #8 patient has had BRCA1 and 2 testing and she was negative for the genes.  CURRENT THERAPY:Letrozole 2.5 mg daily since July 2010  INTERVAL HISTORY: Tina Potter 52 y.o. female returns for Followup visit. overall she is doing well she is tolerating letrozole without any significant problems. She denies any fevers chills night sweats headaches shortness of breath chest pains palpitations no myalgias or arthralgias. She continues to work a lymphedema sleeve in the right arm. She has no back pain no vaginal discharge or bleeding. Remainder of the 10 pointe review of systems is negative. MEDICAL HISTORY: Past Medical History  Diagnosis Date  . Breast cancer   . Lymphedema of arm   . Breast cancer 08/18/2011    ALLERGIES:  has No Known Allergies.  MEDICATIONS:  Current Outpatient Prescriptions  Medication Sig Dispense Refill  . letrozole (FEMARA) 2.5 MG tablet Take 1 tablet (2.5 mg total) by mouth daily.  90 tablet  12  . venlafaxine XR (EFFEXOR-XR) 37.5 MG 24 hr capsule Take 1 capsule (37.5 mg total) by mouth daily.  90 capsule  12  . zolpidem (AMBIEN) 5 MG  tablet Take 1 tablet (5 mg total) by mouth at bedtime as needed for sleep.  30 tablet  0   No current facility-administered medications for this visit.    SURGICAL HISTORY:  Past Surgical History  Procedure Laterality Date  . Mastectomy, modified radical w/reconstruction  01/2008  . Bilateral oophorectomy  12/2008    REVIEW OF SYSTEMS:  Pertinent items are noted in HPI.   PHYSICAL EXAMINATION: General appearance: alert, cooperative and appears stated age Head: Normocephalic, without obvious abnormality, atraumatic Neck: no adenopathy, no carotid bruit, no JVD, supple, symmetrical, trachea midline and thyroid not enlarged, symmetric, no tenderness/mass/nodules Lymph nodes: Cervical, supraclavicular, and axillary nodes normal. Resp: clear to auscultation bilaterally and normal percussion bilaterally Back: symmetric, no curvature. ROM normal. No CVA tenderness. Cardio: regular rate and rhythm, S1, S2 normal, no murmur, click, rub or gallop and normal apical impulse GI: soft, non-tender; bowel sounds normal; no masses,  no organomegaly Extremities: edema right upper extremity edema is noted with a lymphedema sleeve. There is no bilateral lower extremity edema cyanosis or clubbing Neurologic: Alert and oriented X 3, normal strength and tone. Normal symmetric reflexes. Normal coordination and gait Bilateral breast examination right Reconstructed breast there is no skin changes there is surgical scars without any nodularity. Left breast no masses nipple discharge retraction or inversion. ECOG PERFORMANCE STATUS: 0 - Asymptomatic  Blood pressure 115/73, pulse 85, temperature 98.5 F (36.9 C), temperature source Oral, resp. rate 18, height 5\' 7"  (1.702 m), weight 167 lb 3.2 oz (75.841 kg).  LABORATORY DATA: Lab Results  Component Value Date   WBC 7.1  01/31/2013   HGB 12.8 01/31/2013   HCT 37.7 01/31/2013   MCV 91.0 01/31/2013   PLT 245 01/31/2013      Chemistry      Component Value Date/Time    NA 144 01/31/2013 1358   NA 141 08/18/2011 0949   NA 133 01/13/2010 1434   K 4.0 01/31/2013 1358   K 4.2 08/18/2011 0949   K 3.9 01/13/2010 1434   CL 105 07/30/2012 0921   CL 107 08/18/2011 0949   CL 99 01/13/2010 1434   CO2 28 01/31/2013 1358   CO2 24 08/18/2011 0949   CO2 27 01/13/2010 1434   BUN 12.2 01/31/2013 1358   BUN 16 08/18/2011 0949   BUN 12 01/13/2010 1434   CREATININE 0.8 01/31/2013 1358   CREATININE 0.60 08/18/2011 0949   CREATININE 0.8 01/13/2010 1434      Component Value Date/Time   CALCIUM 10.0 01/31/2013 1358   CALCIUM 9.8 08/18/2011 0949   CALCIUM 9.4 01/13/2010 1434   ALKPHOS 56 01/31/2013 1358   ALKPHOS 60 08/18/2011 0949   ALKPHOS 79 01/13/2010 1434   AST 23 01/31/2013 1358   AST 18 08/18/2011 0949   AST 24 01/13/2010 1434   ALT 18 01/31/2013 1358   ALT 16 08/18/2011 0949   ALT 27 01/13/2010 1434   BILITOT 0.29 01/31/2013 1358   BILITOT 0.4 08/18/2011 0949   BILITOT 0.70 01/13/2010 1434       RADIOGRAPHIC STUDIES:  No results found.  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 Patient then went through adjuvant chemotherapy consisting of 6 cycles of TAC. She tolerated this well.  #2 she then underwent postmastectomy radiation therapy.  #3 she is status post bilateral salpingo-oophorectomies.  #4 patient is now on letrozole 2.5 mg daily.  #5 patient does have insomnia She is on Ambien as needed.  PLAN:  #1 overall patient is doing well I will continue to see her every 6 months. She will continue letrozole 2.5 mg daily.  All questions were answered. The patient knows to call the clinic with any problems, questions or concerns. We can certainly see the patient much sooner if necessary.  I spent 15 minutes counseling the patient face to face. The total time spent in the appointment was 20 minutes.    Drue Second, MD Medical/Oncology [facility] [phone] (beeper) [phone] (Office)

## 2013-03-26 ENCOUNTER — Encounter (INDEPENDENT_AMBULATORY_CARE_PROVIDER_SITE_OTHER): Payer: Self-pay

## 2013-04-03 ENCOUNTER — Other Ambulatory Visit: Payer: Self-pay

## 2013-04-03 DIAGNOSIS — Z1231 Encounter for screening mammogram for malignant neoplasm of breast: Secondary | ICD-10-CM

## 2013-05-02 ENCOUNTER — Other Ambulatory Visit: Payer: Self-pay

## 2013-05-02 ENCOUNTER — Ambulatory Visit
Admission: RE | Admit: 2013-05-02 | Discharge: 2013-05-02 | Disposition: A | Discharge status: Home / Self Care | Payer: BC Managed Care – PPO | Source: Ambulatory Visit

## 2013-05-02 DIAGNOSIS — Z1231 Encounter for screening mammogram for malignant neoplasm of breast: Secondary | ICD-10-CM

## 2013-05-07 ENCOUNTER — Telehealth: Payer: Self-pay | Admitting: Oncology

## 2013-05-07 NOTE — Telephone Encounter (Signed)
Faxed pt medical records to [facility]

## 2013-08-05 ENCOUNTER — Other Ambulatory Visit: Payer: Self-pay | Admitting: Oncology

## 2013-08-05 ENCOUNTER — Encounter: Payer: Self-pay | Admitting: Oncology

## 2013-08-05 ENCOUNTER — Other Ambulatory Visit (HOSPITAL_BASED_OUTPATIENT_CLINIC_OR_DEPARTMENT_OTHER): Payer: BC Managed Care – PPO

## 2013-08-05 ENCOUNTER — Telehealth: Payer: Self-pay | Admitting: Oncology

## 2013-08-05 ENCOUNTER — Ambulatory Visit (HOSPITAL_BASED_OUTPATIENT_CLINIC_OR_DEPARTMENT_OTHER): Payer: BC Managed Care – PPO | Admitting: Oncology

## 2013-08-05 VITALS — BP 116/76 | HR 88 | Temp 98.6°F | Resp 20 | Ht 67.0 in | Wt 175.5 lb

## 2013-08-05 DIAGNOSIS — G4700 Insomnia, unspecified: Secondary | ICD-10-CM

## 2013-08-05 DIAGNOSIS — Z1231 Encounter for screening mammogram for malignant neoplasm of breast: Secondary | ICD-10-CM

## 2013-08-05 DIAGNOSIS — Z9011 Acquired absence of right breast and nipple: Secondary | ICD-10-CM

## 2013-08-05 DIAGNOSIS — C50911 Malignant neoplasm of unspecified site of right female breast: Secondary | ICD-10-CM

## 2013-08-05 DIAGNOSIS — C50919 Malignant neoplasm of unspecified site of unspecified female breast: Secondary | ICD-10-CM

## 2013-08-05 LAB — COMPREHENSIVE METABOLIC PANEL (CC13)
ALBUMIN: 4 g/dL (ref 3.5–5.0)
ALK PHOS: 54 U/L (ref 40–150)
ALT: 22 U/L (ref 0–55)
ANION GAP: 9 meq/L (ref 3–11)
AST: 21 U/L (ref 5–34)
BILIRUBIN TOTAL: 0.32 mg/dL (ref 0.20–1.20)
BUN: 16.4 mg/dL (ref 7.0–26.0)
CO2: 28 meq/L (ref 22–29)
Calcium: 9.7 mg/dL (ref 8.4–10.4)
Chloride: 105 mEq/L (ref 98–109)
Creatinine: 0.7 mg/dL (ref 0.6–1.1)
GLUCOSE: 87 mg/dL (ref 70–140)
Potassium: 3.9 mEq/L (ref 3.5–5.1)
Sodium: 142 mEq/L (ref 136–145)
TOTAL PROTEIN: 7.3 g/dL (ref 6.4–8.3)

## 2013-08-05 LAB — CBC WITH DIFFERENTIAL/PLATELET
BASO%: 0.4 % (ref 0.0–2.0)
BASOS ABS: 0 10*3/uL (ref 0.0–0.1)
EOS ABS: 0.2 10*3/uL (ref 0.0–0.5)
EOS%: 3.1 % (ref 0.0–7.0)
HEMATOCRIT: 38.6 % (ref 34.8–46.6)
HEMOGLOBIN: 12.8 g/dL (ref 11.6–15.9)
LYMPH%: 29.5 % (ref 14.0–49.7)
MCH: 30.3 pg (ref 25.1–34.0)
MCHC: 33.2 g/dL (ref 31.5–36.0)
MCV: 91.5 fL (ref 79.5–101.0)
MONO#: 0.7 10*3/uL (ref 0.1–0.9)
MONO%: 9.1 % (ref 0.0–14.0)
NEUT%: 57.9 % (ref 38.4–76.8)
NEUTROS ABS: 4.3 10*3/uL (ref 1.5–6.5)
PLATELETS: 238 10*3/uL (ref 145–400)
RBC: 4.22 10*6/uL (ref 3.70–5.45)
RDW: 13 % (ref 11.2–14.5)
WBC: 7.5 10*3/uL (ref 3.9–10.3)
lymph#: 2.2 10*3/uL (ref 0.9–3.3)

## 2013-08-05 MED ORDER — VENLAFAXINE HCL ER 37.5 MG PO CP24: 37.5000 mg | ORAL_CAPSULE | Freq: Every day | ORAL | Status: DC

## 2013-08-05 NOTE — Progress Notes (Signed)
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  PRIOR THERAPY:  #1 status post right modified radical mastectomy with excellent lymph node dissection for a stage IIB right breast cancer in September 2009.  #2 adjuvant chemotherapy consisting of Taxotere Adriamycin and Cytoxan for a total of 6 cycles between 04/22/2008 to 08/05/2008.  #3 patient underwent postmastectomy radiation from 10/02/2008 2 10/15/2008  #4 tamoxifen 20 mg daily for Nov 04 2008 12/26/2008 but this was discontinued.  #5 01/07/2009 patient underwent bilateral salpingo-oophorectomies.  #6 patient was then begun on letrozole 2.5 mg daily starting 01/14/2009.  #7 05/03/2009 patient underwent right tram flap breast reconstruction  #8 patient has had BRCA1 and 2 testing and she was negative for the genes.  CURRENT THERAPY:Letrozole 2.5 mg daily since July 2010  INTERVAL HISTORY: Tina Potter 53 y.o. female returns for Followup visit. overall she is doing well she is tolerating letrozole without any significant problems. She denies any fevers chills night sweats headaches shortness of breath chest pains palpitations no myalgias or arthralgias. She continues to work a lymphedema sleeve in the right arm. She has no back pain no vaginal discharge or bleeding. Remainder of the 10 pointe review of systems is negative.  MEDICAL HISTORY: Past Medical History  Diagnosis Date  . Breast cancer   . Lymphedema of arm   . Breast cancer 08/18/2011    ALLERGIES:  has No Known Allergies.  MEDICATIONS:  Current Outpatient Prescriptions  Medication Sig Dispense Refill  . letrozole (FEMARA) 2.5 MG tablet Take 1 tablet (2.5 mg total) by mouth daily.  90 tablet  12  . polyethylene glycol powder (GLYCOLAX/MIRALAX) powder       . venlafaxine XR (EFFEXOR-XR) 37.5 MG 24 hr capsule Take 1 capsule (37.5 mg  total) by mouth daily.  90 capsule  12  . zolpidem (AMBIEN) 5 MG tablet Take 1 tablet (5 mg total) by mouth at bedtime as needed for sleep.  30 tablet  0   No current facility-administered medications for this visit.    SURGICAL HISTORY:  Past Surgical History  Procedure Laterality Date  . Mastectomy, modified radical w/reconstruction  01/2008  . Bilateral oophorectomy  12/2008    REVIEW OF SYSTEMS:  Pertinent items are noted in HPI.   PHYSICAL EXAMINATION: General appearance: alert, cooperative and appears stated age Head: Normocephalic, without obvious abnormality, atraumatic Neck: no adenopathy, no carotid bruit, no JVD, supple, symmetrical, trachea midline and thyroid not enlarged, symmetric, no tenderness/mass/nodules Lymph nodes: Cervical, supraclavicular, and axillary nodes normal. Resp: clear to auscultation bilaterally and normal percussion bilaterally Back: symmetric, no curvature. ROM normal. No CVA tenderness. Cardio: regular rate and rhythm, S1, S2 normal, no murmur, click, rub or gallop and normal apical impulse GI: soft, non-tender; bowel sounds normal; no masses,  no organomegaly Extremities: edema right upper extremity edema is noted with a lymphedema sleeve. There is no bilateral lower extremity edema cyanosis or clubbing Neurologic: Alert and oriented X 3, normal strength and tone. Normal symmetric reflexes. Normal coordination and gait Bilateral breast examination right Reconstructed breast there is no skin changes there is surgical scars without any nodularity. Left breast no masses nipple discharge retraction or inversion. ECOG PERFORMANCE STATUS: 0 - Asymptomatic  Blood pressure 116/76, pulse 88, temperature 98.6 F (37 C), temperature source Oral, resp. rate 20, height _0  (1.702 m), weight 175 lb 8 oz (79.606 kg).  LABORATORY DATA: Lab Results  Component Value Date   WBC 7.5 08/05/2013   HGB 12.8 08/05/2013   HCT 38.6 08/05/2013   MCV 91.5 08/05/2013   PLT 238  08/05/2013      Chemistry      Component Value Date/Time   NA 142 08/05/2013 1406   NA 141 08/18/2011 0949   NA 133 01/13/2010 1434   K 3.9 08/05/2013 1406   K 4.2 08/18/2011 0949   K 3.9 01/13/2010 1434   CL 105 07/30/2012 0921   CL 107 08/18/2011 0949   CL 99 01/13/2010 1434   CO2 28 08/05/2013 1406   CO2 24 08/18/2011 0949   CO2 27 01/13/2010 1434   BUN 16.4 08/05/2013 1406   BUN 16 08/18/2011 0949   BUN 12 01/13/2010 1434   CREATININE 0.7 08/05/2013 1406   CREATININE 0.60 08/18/2011 0949   CREATININE 0.8 01/13/2010 1434      Component Value Date/Time   CALCIUM 9.7 08/05/2013 1406   CALCIUM 9.8 08/18/2011 0949   CALCIUM 9.4 01/13/2010 1434   ALKPHOS 54 08/05/2013 1406   ALKPHOS 60 08/18/2011 0949   ALKPHOS 79 01/13/2010 1434   AST 21 08/05/2013 1406   AST 18 08/18/2011 0949   AST 24 01/13/2010 1434   ALT 22 08/05/2013 1406   ALT 16 08/18/2011 0949   ALT 27 01/13/2010 1434   BILITOT 0.32 08/05/2013 1406   BILITOT 0.4 08/18/2011 0949   BILITOT 0.70 01/13/2010 1434       RADIOGRAPHIC STUDIES:  No results found.  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 Patient then went through adjuvant chemotherapy consisting of 6 cycles of TAC. She tolerated this well. Patient has no evidence of recurrent disease  #2 she then underwent postmastectomy radiation therapy.  #3 she is status post bilateral salpingo-oophorectomies.  #4 patient is now on letrozole 2.5 mg daily.  #5 patient does have insomnia She is on Ambien as needed.  #6 patient will be seen back in 6 months time for followup  All questions were answered. The patient knows to call the clinic with any problems, questions or concerns. We can certainly see the patient much sooner if necessary.  I spent 15 minutes counseling the patient face to face. The total time spent in the appointment was 20  minutes.    Marcy Panning, MD Medical/Oncology [facility] [phone] (beeper) [phone] (Office)

## 2013-08-05 NOTE — Telephone Encounter (Signed)
, °

## 2013-08-06 LAB — VITAMIN D 25 HYDROXY (VIT D DEFICIENCY, FRACTURES): VIT D 25 HYDROXY: 43 ng/mL (ref 30–89)

## 2013-08-07 ENCOUNTER — Telehealth: Payer: Self-pay | Admitting: *Deleted

## 2013-08-07 NOTE — Telephone Encounter (Signed)
As noted below by Dr. Humphrey Rolls, I informed patient that her vitamin D level was good. There are no changes to medications. Patient verbalized understanding.

## 2013-08-07 NOTE — Telephone Encounter (Signed)
Message copied by Hebert Soho on Wed Aug 07, 2013  4:36 PM ------      Message from: Deatra Robinson      Created: Tue Aug 06, 2013  4:47 AM       Call patient: vitamin D level good, no changes ------

## 2013-10-14 ENCOUNTER — Other Ambulatory Visit: Payer: Self-pay | Admitting: *Deleted

## 2013-10-14 DIAGNOSIS — C50919 Malignant neoplasm of unspecified site of unspecified female breast: Secondary | ICD-10-CM

## 2013-10-14 MED ORDER — LETROZOLE 2.5 MG PO TABS: 2.5000 mg | ORAL_TABLET | Freq: Every day | ORAL | Status: DC

## 2014-03-22 ENCOUNTER — Telehealth: Payer: Self-pay | Admitting: Hematology and Oncology

## 2014-03-22 NOTE — Telephone Encounter (Signed)
Spk w/pt confirming MD/schedule change, mailing out updated sch.....  KJ °

## 2014-05-05 ENCOUNTER — Ambulatory Visit
Admission: RE | Admit: 2014-05-05 | Discharge: 2014-05-05 | Disposition: A | Discharge status: Home / Self Care | Payer: BC Managed Care – PPO | Source: Ambulatory Visit | Attending: Oncology | Admitting: Oncology

## 2014-05-05 ENCOUNTER — Encounter (INDEPENDENT_AMBULATORY_CARE_PROVIDER_SITE_OTHER): Payer: Self-pay

## 2014-05-05 DIAGNOSIS — C50919 Malignant neoplasm of unspecified site of unspecified female breast: Secondary | ICD-10-CM

## 2014-05-05 DIAGNOSIS — Z9011 Acquired absence of right breast and nipple: Secondary | ICD-10-CM

## 2014-05-05 DIAGNOSIS — Z1231 Encounter for screening mammogram for malignant neoplasm of breast: Secondary | ICD-10-CM

## 2014-05-08 ENCOUNTER — Other Ambulatory Visit: Payer: Self-pay | Admitting: Oncology

## 2014-05-08 DIAGNOSIS — R928 Other abnormal and inconclusive findings on diagnostic imaging of breast: Secondary | ICD-10-CM

## 2014-05-16 ENCOUNTER — Other Ambulatory Visit: Payer: Self-pay | Admitting: Hematology and Oncology

## 2014-05-16 DIAGNOSIS — R928 Other abnormal and inconclusive findings on diagnostic imaging of breast: Secondary | ICD-10-CM

## 2014-05-21 ENCOUNTER — Telehealth: Payer: Self-pay

## 2014-05-21 NOTE — Telephone Encounter (Signed)
Bone density results received from [name] dtd 11/22/1960.  Copy to Dr Lindi Adie.  Original to scan.

## 2014-05-26 ENCOUNTER — Ambulatory Visit
Admission: RE | Admit: 2014-05-26 | Discharge: 2014-05-26 | Disposition: A | Discharge status: Home / Self Care | Payer: BC Managed Care – PPO | Source: Ambulatory Visit | Attending: Oncology | Admitting: Oncology

## 2014-05-26 ENCOUNTER — Other Ambulatory Visit: Payer: Self-pay | Admitting: Hematology and Oncology

## 2014-05-26 DIAGNOSIS — R928 Other abnormal and inconclusive findings on diagnostic imaging of breast: Secondary | ICD-10-CM

## 2014-06-02 ENCOUNTER — Other Ambulatory Visit: Payer: BC Managed Care – PPO

## 2014-06-02 ENCOUNTER — Ambulatory Visit: Payer: BC Managed Care – PPO | Admitting: Oncology

## 2014-06-02 ENCOUNTER — Other Ambulatory Visit: Payer: Self-pay | Admitting: *Deleted

## 2014-06-02 DIAGNOSIS — C50919 Malignant neoplasm of unspecified site of unspecified female breast: Secondary | ICD-10-CM

## 2014-06-03 ENCOUNTER — Ambulatory Visit (HOSPITAL_BASED_OUTPATIENT_CLINIC_OR_DEPARTMENT_OTHER): Payer: BC Managed Care – PPO | Admitting: Hematology and Oncology

## 2014-06-03 ENCOUNTER — Other Ambulatory Visit (HOSPITAL_BASED_OUTPATIENT_CLINIC_OR_DEPARTMENT_OTHER): Payer: BC Managed Care – PPO

## 2014-06-03 ENCOUNTER — Telehealth: Payer: Self-pay | Admitting: Hematology and Oncology

## 2014-06-03 ENCOUNTER — Other Ambulatory Visit: Payer: Self-pay | Admitting: *Deleted

## 2014-06-03 VITALS — BP 126/67 | HR 81 | Temp 98.5°F | Resp 18 | Ht 67.0 in | Wt 170.2 lb

## 2014-06-03 DIAGNOSIS — M8580 Other specified disorders of bone density and structure, unspecified site: Secondary | ICD-10-CM

## 2014-06-03 DIAGNOSIS — C50511 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 female breast: Secondary | ICD-10-CM

## 2014-06-03 DIAGNOSIS — C50919 Malignant neoplasm of unspecified site of unspecified female breast: Secondary | ICD-10-CM

## 2014-06-03 DIAGNOSIS — C773 Secondary and unspecified malignant neoplasm of axilla and upper limb lymph nodes: Secondary | ICD-10-CM

## 2014-06-03 LAB — COMPREHENSIVE METABOLIC PANEL (CC13)
ALBUMIN: 3.8 g/dL (ref 3.5–5.0)
ALT: 19 U/L (ref 0–55)
AST: 20 U/L (ref 5–34)
Alkaline Phosphatase: 58 U/L (ref 40–150)
Anion Gap: 11 mEq/L (ref 3–11)
BUN: 12.5 mg/dL (ref 7.0–26.0)
CO2: 26 mEq/L (ref 22–29)
Calcium: 9.5 mg/dL (ref 8.4–10.4)
Chloride: 105 mEq/L (ref 98–109)
Creatinine: 0.7 mg/dL (ref 0.6–1.1)
GLUCOSE: 90 mg/dL (ref 70–140)
POTASSIUM: 3.8 meq/L (ref 3.5–5.1)
Sodium: 142 mEq/L (ref 136–145)
TOTAL PROTEIN: 7.1 g/dL (ref 6.4–8.3)
Total Bilirubin: 0.38 mg/dL (ref 0.20–1.20)

## 2014-06-03 LAB — CBC WITH DIFFERENTIAL/PLATELET
BASO%: 0.8 % (ref 0.0–2.0)
Basophils Absolute: 0 10*3/uL (ref 0.0–0.1)
EOS ABS: 0.2 10*3/uL (ref 0.0–0.5)
EOS%: 3.9 % (ref 0.0–7.0)
HCT: 40.6 % (ref 34.8–46.6)
HGB: 13.4 g/dL (ref 11.6–15.9)
LYMPH%: 35.2 % (ref 14.0–49.7)
MCH: 30.1 pg (ref 25.1–34.0)
MCHC: 33 g/dL (ref 31.5–36.0)
MCV: 91.1 fL (ref 79.5–101.0)
MONO#: 0.5 10*3/uL (ref 0.1–0.9)
MONO%: 9.6 % (ref 0.0–14.0)
NEUT%: 50.5 % (ref 38.4–76.8)
NEUTROS ABS: 2.6 10*3/uL (ref 1.5–6.5)
Platelets: 238 10*3/uL (ref 145–400)
RBC: 4.46 10*6/uL (ref 3.70–5.45)
RDW: 12.3 % (ref 11.2–14.5)
WBC: 5.1 10*3/uL (ref 3.9–10.3)
lymph#: 1.8 10*3/uL (ref 0.9–3.3)

## 2014-06-03 MED ORDER — UNABLE TO FIND: Status: DC

## 2014-06-03 MED ORDER — VENLAFAXINE HCL ER 37.5 MG PO CP24: 37.5000 mg | ORAL_CAPSULE | Freq: Every day | ORAL | Status: DC

## 2014-06-03 MED ORDER — LETROZOLE 2.5 MG PO TABS: 2.5000 mg | ORAL_TABLET | Freq: Every day | ORAL | Status: DC

## 2014-06-03 NOTE — Telephone Encounter (Signed)
per pof to sch pt appt-gave pt copy of sch °

## 2014-06-03 NOTE — Progress Notes (Signed)
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  PRIOR THERAPY:  #1 status post right modified radical mastectomy with excellent lymph node dissection for a stage IIB right breast cancer in September 2009.  #2 adjuvant chemotherapy consisting of Taxotere Adriamycin and Cytoxan for a total of 6 cycles between 04/22/2008 to 08/05/2008.  #3 patient underwent postmastectomy radiation from 10/02/2008 2 10/15/2008  #4 tamoxifen 20 mg daily for Nov 04 2008 12/26/2008 but this was discontinued.  #5 01/07/2009 patient underwent bilateral salpingo-oophorectomies.  #6 patient was then begun on letrozole 2.5 mg daily starting 01/14/2009.  #7 05/03/2009 patient underwent right tram flap breast reconstruction  #8 patient has had BRCA1 and 2 testing and she was negative for the genes.  CURRENT THERAPY:Letrozole 2.5 mg daily since July 2010  CHIEF COMPLIANT: left breast calcifications on mammogram  INTERVAL HISTORY: Tina Potter is a 53 year old Caucasian with above-mentioned history of right-sided breast cancer treated with mastectomy followed by chemotherapy radiation and has been on oral antiestrogen therapy since 2010 with letrozole. She has been tolerating letrozole extremely well without any major problems or concerns. She does have some stiffness but otherwise feeling well. She had a bone density test done in November 2015 which showed osteopenia in the back.  REVIEW OF SYSTEMS:   Constitutional: Denies fevers, chills or abnormal weight loss Eyes: Denies blurriness of vision Ears, nose, mouth, throat, and face: Denies mucositis or  sore throat Respiratory: Denies cough, dyspnea or wheezes Cardiovascular: Denies palpitation, chest discomfort or lower extremity swelling Gastrointestinal:  Denies nausea, heartburn or change in bowel habits Skin: Denies abnormal skin rashes Lymphatics: Denies new lymphadenopathy or easy bruising Neurological:Denies numbness, tingling or new weaknesses Behavioral/Psych: Mood is stable, no new changes  Breast:  denies any pain or lumps or nodules in either breasts, right breast reconstructed All other systems were reviewed with the patient and are negative.  I have reviewed the past medical history, past surgical history, social history and family history with the patient and they are unchanged from previous note.  ALLERGIES:  has No Known Allergies.  MEDICATIONS:  Current Outpatient Prescriptions  Medication Sig Dispense Refill  . letrozole (FEMARA) 2.5 MG tablet Take 1 tablet (2.5 mg total) by mouth daily. 90 tablet 3  . venlafaxine XR (EFFEXOR-XR) 37.5 MG 24 hr capsule Take 1 capsule (37.5 mg total) by mouth daily with breakfast. 90 capsule 6  . UNABLE TO FIND Dispense compression sleeve 20-30 mm for this patient with history of breast cancer s/p surgery 1 each 0   No current facility-administered medications for this visit.    PHYSICAL EXAMINATION: ECOG PERFORMANCE STATUS: 1 - Symptomatic but completely ambulatory  Filed Vitals:   06/03/14 0923  BP: 126/67  Pulse: 81  Temp: 98.5 F (36.9 C)  Resp: 18   Filed Weights   06/03/14 0923  Weight: 170 lb 3.2 oz (77.202 kg)    GENERAL:alert, no distress and comfortable SKIN: skin color, texture, turgor are normal, no rashes or significant lesions EYES: normal, Conjunctiva are pink and non-injected, sclera clear OROPHARYNX:no exudate, no erythema and lips, buccal mucosa, and tongue normal  NECK:  supple, thyroid normal size, non-tender, without nodularity LYMPH:  no palpable lymphadenopathy in the cervical, axillary or  inguinal LUNGS: clear to auscultation and percussion with normal breathing effort HEART: regular rate & rhythm and no murmurs and no lower extremity edema ABDOMEN:abdomen soft, non-tender and normal bowel sounds Musculoskeletal:no cyanosis of digits and no clubbing  NEURO: alert & oriented x 3 with fluent speech, no focal motor/sensory deficits BREAST: No palpable masses or nodules in either right or left breasts. Right breast reconstructed no palpable lymphadenopathy. Left breast no palpable masses or nodules no axillary lymph nodes   LABORATORY DATA:  I have reviewed the data as listed   Chemistry      Component Value Date/Time   NA 142 06/03/2014 0906   NA 141 08/18/2011 0949   NA 133 01/13/2010 1434   K 3.8 06/03/2014 0906   K 4.2 08/18/2011 0949   K 3.9 01/13/2010 1434   CL 105 07/30/2012 0921   CL 107 08/18/2011 0949   CL 99 01/13/2010 1434   CO2 26 06/03/2014 0906   CO2 24 08/18/2011 0949   CO2 27 01/13/2010 1434   BUN 12.5 06/03/2014 0906   BUN 16 08/18/2011 0949   BUN 12 01/13/2010 1434   CREATININE 0.7 06/03/2014 0906   CREATININE 0.60 08/18/2011 0949   CREATININE 0.8 01/13/2010 1434      Component Value Date/Time   CALCIUM 9.5 06/03/2014 0906   CALCIUM 9.8 08/18/2011 0949   CALCIUM 9.4 01/13/2010 1434   ALKPHOS 58 06/03/2014 0906   ALKPHOS 60 08/18/2011 0949   ALKPHOS 79 01/13/2010 1434   AST 20 06/03/2014 0906   AST 18 08/18/2011 0949   AST 24 01/13/2010 1434   ALT 19 06/03/2014 0906   ALT 16 08/18/2011 0949   ALT 27 01/13/2010 1434   BILITOT 0.38 06/03/2014 0906   BILITOT 0.4 08/18/2011 0949   BILITOT 0.70 01/13/2010 1434       Lab Results  Component Value Date   WBC 5.1 06/03/2014   HGB 13.4 06/03/2014   HCT 40.6 06/03/2014   MCV 91.1 06/03/2014   PLT 238 06/03/2014   NEUTROABS 2.6 06/03/2014     RADIOGRAPHIC STUDIES: I have personally reviewed the radiology reports and agreed with their findings. Mammogram left breast calcifications  going to be biopsied this Friday  ASSESSMENT & PLAN:  Breast cancer of lower-outer quadrant of right female breast Right breast cancer stage IIB status post right modified radical mastectomy with axillary lymph node dissection September 2009 treated with adjuvant chemotherapy with TAC x6 cycles and post mastectomy radiation completed 10/15/2008, given tamoxifen from 2007 to 2010 and underwent bilateral salpingo-oophorectomies and start omeprazole 01/14/2009, undergone TRAM flap reconstruction.  Aromatase inhibitor toxicities: 1. Osteopenia:encouraged the patient to take calcium and vitamin D twice a day.   Treatment plan: Patient had completed 5 years of aromatase inhibitor therapy. I discussed the pros and cons of extended adjuvant therapy. She understands that there is data for 10 years of tamoxifen being better than 5 years and that the trials for extended aromatase inhibitor therapy ongoing. We discussed pros and cons of continuing beyond 5 years. I offered to test her with breast cancer index (BCI) which would predict patients who might respond to extended adjuvant therapy. Patient chose to remain on aromatase inhibitor therapy for 5 more years fully understanding the risks of long-term treatment and lack of information on benefits.  Surveillance: left breast mammogram done November 2015 revealed suspicious calcifications concerning for malignancy. These  are going to be biopsied this Friday. I will followup on those results. On physical exam I do not feel any nodules or lumps.  Return to clinic in 6 months for followup.   No orders of the defined types were placed in this encounter.   The patient has a good understanding of the overall plan. she agrees with it. She will call with any problems that may develop before her next visit here.   Rulon Eisenmenger, MD 06/03/2014 9:59 AM

## 2014-06-03 NOTE — Assessment & Plan Note (Addendum)
Right breast cancer stage IIB status post right modified radical mastectomy with axillary lymph node dissection September 2009 treated with adjuvant chemotherapy with TAC x6 cycles and post mastectomy radiation completed 10/15/2008, given tamoxifen from 2007 to 2010 and underwent bilateral salpingo-oophorectomies and start omeprazole 01/14/2009, undergone TRAM flap reconstruction.  Aromatase inhibitor toxicities: 1. Osteopenia:encouraged the patient to take calcium and vitamin D twice a day.   Treatment plan: Patient had completed 5 years of aromatase inhibitor therapy. I discussed the pros and cons of extended adjuvant therapy. She understands that there is data for 10 years of tamoxifen being better than 5 years and that the trials for extended aromatase inhibitor therapy ongoing. We discussed pros and cons of continuing beyond 5 years. I offered to test her with breast cancer index (BCI) which would predict patients who might respond to extended adjuvant therapy. Patient chose to remain on aromatase inhibitor therapy for 5 more years fully understanding the risks of long-term treatment and lack of information on benefits.  Surveillance: left breast mammogram done November 2015 revealed suspicious calcifications concerning for malignancy. These are going to be biopsied this Friday. I will followup on those results. On physical exam I do not feel any nodules or lumps.  Return to clinic in 6 months for followup.

## 2014-06-06 ENCOUNTER — Ambulatory Visit
Admission: RE | Admit: 2014-06-06 | Discharge: 2014-06-06 | Disposition: A | Discharge status: Home / Self Care | Payer: BC Managed Care – PPO | Source: Ambulatory Visit | Attending: Hematology and Oncology | Admitting: Hematology and Oncology

## 2014-06-06 DIAGNOSIS — R928 Other abnormal and inconclusive findings on diagnostic imaging of breast: Secondary | ICD-10-CM

## 2014-06-06 HISTORY — PX: BREAST BIOPSY: SHX20

## 2014-06-09 ENCOUNTER — Other Ambulatory Visit: Payer: Self-pay | Admitting: *Deleted

## 2014-06-09 DIAGNOSIS — C50511 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 female breast: Secondary | ICD-10-CM

## 2014-06-09 MED ORDER — UNABLE TO FIND: Status: DC

## 2014-07-28 ENCOUNTER — Telehealth: Payer: Self-pay | Admitting: Hematology and Oncology

## 2014-07-28 NOTE — Telephone Encounter (Signed)
due to [facility] mvoed 6/14 app tto 6/28. left message for pt and mailed schedule.

## 2014-12-09 ENCOUNTER — Ambulatory Visit: Payer: BC Managed Care – PPO | Admitting: Hematology and Oncology

## 2014-12-22 NOTE — Assessment & Plan Note (Signed)
Right breast cancer stage IIB status post right modified radical mastectomy with axillary lymph node dissection September 2009 treated with adjuvant chemotherapy with TAC x6 cycles and post mastectomy radiation completed 10/15/2008, given tamoxifen from 2007 to 2010 and underwent bilateral salpingo-oophorectomies and start omeprazole 01/14/2009, undergone TRAM flap reconstruction.  Aromatase inhibitor toxicities: Plan is 10 years 1. Osteopenia:encouraged the patient to take calcium and vitamin D twice a day.  Breast Cancer Surveillance: 1. Breast exam 12/22/14: Normal 2. Mammogram 05/26/14 Susp calcifications, Biopsied Benign  RTC in 1 year for follow up

## 2014-12-23 ENCOUNTER — Ambulatory Visit (HOSPITAL_BASED_OUTPATIENT_CLINIC_OR_DEPARTMENT_OTHER): Payer: BLUE CROSS/BLUE SHIELD | Admitting: Hematology and Oncology

## 2014-12-23 ENCOUNTER — Encounter: Payer: Self-pay | Admitting: Hematology and Oncology

## 2014-12-23 VITALS — BP 116/64 | HR 77 | Temp 98.3°F | Resp 18 | Ht 67.0 in | Wt 172.1 lb

## 2014-12-23 DIAGNOSIS — C50511 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 female breast: Secondary | ICD-10-CM | POA: Diagnosis not present

## 2014-12-23 DIAGNOSIS — M8580 Other specified disorders of bone density and structure, unspecified site: Secondary | ICD-10-CM

## 2014-12-23 NOTE — Progress Notes (Signed)
Patient Care Team: Erroll Luna, MD as PCP - General (General Surgery)  DIAGNOSIS: Breast cancer of lower-outer quadrant of right female breast   Staging form: Breast, AJCC 7th Edition     Clinical: Stage IIB (Free text: invasive ductal cancer) - Signed by Deatra Robinson, MD on 08/18/2011     Pathologic: No stage assigned - Unsigned   SUMMARY OF ONCOLOGIC HISTORY:   Breast cancer of lower-outer quadrant of right female breast   03/17/2008 Surgery right modified radical mastectomy with excellent lymph node dissection for a stage IIB right breast cancer   04/22/2008 - 08/05/2008 Chemotherapy Taxotere Adriamycin and Cytoxan for a total of 6 cycles    10/02/2008 - 10/15/2008 Radiation Therapy Adj XRT   11/04/2008 - 01/13/2009 Anti-estrogen oral therapy tamoxifen 20 mg daily but this was discontinued   12/08/2008 Surgery BSO   01/07/2009 -  Anti-estrogen oral therapy Letrozole   05/03/2009 Surgery right tram flap breast reconstruction; BRCA 1,2 Neg    CHIEF COMPLIANT: Follow-up on letrozole  INTERVAL HISTORY: Tina Potter is a 54 year old with above-mentioned history of right breast cancer currently on hormonal therapy with letrozole and tolerating it extremely well. She has occasional hot flashes but otherwise denies any muscle aches or pains. She had a bone density test done in November 2015 which showed a T score of -1.3. Denies any lumps or nodules in the breast.  REVIEW OF SYSTEMS:   Constitutional: Denies fevers, chills or abnormal weight loss Eyes: Denies blurriness of vision Ears, nose, mouth, throat, and face: Denies mucositis or sore throat Respiratory: Denies cough, dyspnea or wheezes Cardiovascular: Denies palpitation, chest discomfort or lower extremity swelling Gastrointestinal:  Denies nausea, heartburn or change in bowel habits Skin: Denies abnormal skin rashes Lymphatics: Denies new lymphadenopathy or easy bruising Neurological:Denies numbness, tingling or new  weaknesses Behavioral/Psych: Mood is stable, no new changes  Breast:  denies any pain or lumps or nodules in either breasts All other systems were reviewed with the patient and are negative.  I have reviewed the past medical history, past surgical history, social history and family history with the patient and they are unchanged from previous note.  ALLERGIES:  has No Known Allergies.  MEDICATIONS:  Current Outpatient Prescriptions  Medication Sig Dispense Refill  . letrozole (FEMARA) 2.5 MG tablet Take 1 tablet (2.5 mg total) by mouth daily. 90 tablet 3  . UNABLE TO FIND Dispense class II compression sleeve 30-40 mm/hg/ and glove for this patient with history of breast cancer s/p surgery 1 each 0  . venlafaxine XR (EFFEXOR-XR) 37.5 MG 24 hr capsule Take 1 capsule (37.5 mg total) by mouth daily with breakfast. 90 capsule 6   No current facility-administered medications for this visit.    PHYSICAL EXAMINATION: ECOG PERFORMANCE STATUS: 1 - Symptomatic but completely ambulatory  Filed Vitals:   12/23/14 1146  BP: 116/64  Pulse: 77  Temp: 98.3 F (36.8 C)  Resp: 18   Filed Weights   12/23/14 1146  Weight: 172 lb 1.6 oz (78.064 kg)    GENERAL:alert, no distress and comfortable SKIN: skin color, texture, turgor are normal, no rashes or significant lesions EYES: normal, Conjunctiva are pink and non-injected, sclera clear OROPHARYNX:no exudate, no erythema and lips, buccal mucosa, and tongue normal  NECK: supple, thyroid normal size, non-tender, without nodularity LYMPH:  no palpable lymphadenopathy in the cervical, axillary or inguinal LUNGS: clear to auscultation and percussion with normal breathing effort HEART: regular rate & rhythm and no murmurs and no  lower extremity edema ABDOMEN:abdomen soft, non-tender and normal bowel sounds Musculoskeletal:no cyanosis of digits and no clubbing  NEURO: alert & oriented x 3 with fluent speech, no focal motor/sensory deficits BREAST:  Right breast reconstruction palpable axillary lymphadenopathy, left breasts no palpable masses or nodules or lymph nodes.. (exam performed in the presence of a chaperone)  LABORATORY DATA:  I have reviewed the data as listed   Chemistry      Component Value Date/Time   NA 142 06/03/2014 0906   NA 141 08/18/2011 0949   NA 133 01/13/2010 1434   K 3.8 06/03/2014 0906   K 4.2 08/18/2011 0949   K 3.9 01/13/2010 1434   CL 105 07/30/2012 0921   CL 107 08/18/2011 0949   CL 99 01/13/2010 1434   CO2 26 06/03/2014 0906   CO2 24 08/18/2011 0949   CO2 27 01/13/2010 1434   BUN 12.5 06/03/2014 0906   BUN 16 08/18/2011 0949   BUN 12 01/13/2010 1434   CREATININE 0.7 06/03/2014 0906   CREATININE 0.60 08/18/2011 0949   CREATININE 0.8 01/13/2010 1434      Component Value Date/Time   CALCIUM 9.5 06/03/2014 0906   CALCIUM 9.8 08/18/2011 0949   CALCIUM 9.4 01/13/2010 1434   ALKPHOS 58 06/03/2014 0906   ALKPHOS 60 08/18/2011 0949   ALKPHOS 79 01/13/2010 1434   AST 20 06/03/2014 0906   AST 18 08/18/2011 0949   AST 24 01/13/2010 1434   ALT 19 06/03/2014 0906   ALT 16 08/18/2011 0949   ALT 27 01/13/2010 1434   BILITOT 0.38 06/03/2014 0906   BILITOT 0.4 08/18/2011 0949   BILITOT 0.70 01/13/2010 1434       Lab Results  Component Value Date   WBC 5.1 06/03/2014   HGB 13.4 06/03/2014   HCT 40.6 06/03/2014   MCV 91.1 06/03/2014   PLT 238 06/03/2014   NEUTROABS 2.6 06/03/2014   ASSESSMENT & PLAN:  Breast cancer of lower-outer quadrant of right female breast Right breast cancer stage IIB status post right modified radical mastectomy with axillary lymph node dissection September 2009 treated with adjuvant chemotherapy with TAC x6 cycles and post mastectomy radiation completed 10/15/2008, given tamoxifen from 2007 to 2010 and underwent bilateral salpingo-oophorectomies and start omeprazole 01/14/2009, undergone TRAM flap reconstruction.  Aromatase inhibitor toxicities: Plan is 10 years 1.  Osteopenia bone density done in November 2015 T score -1.3:encouraged the patient to take calcium and vitamin D twice a day.  Breast Cancer Surveillance: 1. Breast exam 12/22/14: Normal 2. Mammogram 05/26/14 Susp calcifications, Biopsied Benign  RTC in 1 year for follow up    No orders of the defined types were placed in this encounter.   The patient has a good understanding of the overall plan. she agrees with it. she will call with any problems that may develop before the next visit here.   Rulon Eisenmenger, MD

## 2015-04-14 ENCOUNTER — Other Ambulatory Visit: Payer: Self-pay

## 2015-04-14 DIAGNOSIS — Z9011 Acquired absence of right breast and nipple: Secondary | ICD-10-CM

## 2015-04-14 DIAGNOSIS — Z1231 Encounter for screening mammogram for malignant neoplasm of breast: Secondary | ICD-10-CM

## 2015-05-28 ENCOUNTER — Ambulatory Visit
Admission: RE | Admit: 2015-05-28 | Discharge: 2015-05-28 | Disposition: A | Discharge status: Home / Self Care | Payer: BLUE CROSS/BLUE SHIELD | Source: Ambulatory Visit

## 2015-05-28 DIAGNOSIS — Z1231 Encounter for screening mammogram for malignant neoplasm of breast: Secondary | ICD-10-CM

## 2015-05-28 DIAGNOSIS — Z9011 Acquired absence of right breast and nipple: Secondary | ICD-10-CM

## 2015-07-06 ENCOUNTER — Other Ambulatory Visit: Payer: Self-pay | Admitting: Hematology and Oncology

## 2015-07-06 ENCOUNTER — Telehealth: Payer: Self-pay | Admitting: Hematology and Oncology

## 2015-07-06 ENCOUNTER — Other Ambulatory Visit: Payer: Self-pay

## 2015-07-06 NOTE — Telephone Encounter (Signed)
Aware to have her call for June follow up

## 2015-07-06 NOTE — Telephone Encounter (Signed)
Chart reviewed.

## 2015-07-17 ENCOUNTER — Other Ambulatory Visit: Payer: Self-pay | Admitting: Hematology and Oncology

## 2015-07-17 DIAGNOSIS — C50511 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 female breast: Secondary | ICD-10-CM

## 2016-02-22 ENCOUNTER — Other Ambulatory Visit: Payer: Self-pay | Admitting: Hematology and Oncology

## 2016-02-22 ENCOUNTER — Other Ambulatory Visit: Payer: Self-pay

## 2016-02-22 NOTE — Telephone Encounter (Signed)
Chart reviewed.

## 2016-02-24 ENCOUNTER — Telehealth: Payer: Self-pay | Admitting: Hematology and Oncology

## 2016-02-24 NOTE — Telephone Encounter (Signed)
lvm to inform pt of 9/14 appt per pof

## 2016-03-10 ENCOUNTER — Encounter: Payer: Self-pay | Admitting: Hematology and Oncology

## 2016-03-10 ENCOUNTER — Ambulatory Visit (HOSPITAL_BASED_OUTPATIENT_CLINIC_OR_DEPARTMENT_OTHER): Payer: BLUE CROSS/BLUE SHIELD | Admitting: Hematology and Oncology

## 2016-03-10 DIAGNOSIS — Z79811 Long term (current) use of aromatase inhibitors: Secondary | ICD-10-CM

## 2016-03-10 DIAGNOSIS — M8588 Other specified disorders of bone density and structure, other site: Secondary | ICD-10-CM | POA: Diagnosis not present

## 2016-03-10 DIAGNOSIS — Z853 Personal history of malignant neoplasm of breast: Secondary | ICD-10-CM

## 2016-03-10 DIAGNOSIS — C50511 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 female breast: Secondary | ICD-10-CM

## 2016-03-10 MED ORDER — VENLAFAXINE HCL ER 37.5 MG PO CP24: ORAL_CAPSULE | ORAL | 0 refills | Status: DC

## 2016-03-10 MED ORDER — LETROZOLE 2.5 MG PO TABS: 2.5000 mg | ORAL_TABLET | Freq: Every day | ORAL | 3 refills | Status: DC

## 2016-03-10 NOTE — Progress Notes (Signed)
Patient Care Team: Erroll Luna, MD as PCP - General (General Surgery)  DIAGNOSIS: Breast cancer of lower-outer quadrant of right female breast [facility])   Staging form: Breast, AJCC 7th Edition   - Clinical: Stage IIB (Free text: invasive ductal cancer) - Signed by Deatra Robinson, MD on 08/18/2011   - Pathologic: No stage assigned - Unsigned  SUMMARY OF ONCOLOGIC HISTORY: Oncology History   Tamoxifen 20 mg daily stopped     Breast cancer of lower-outer quadrant of right female breast ([place])   03/17/2008 Surgery    right modified radical mastectomy with excellent lymph node dissection for a stage IIB right breast cancer      04/22/2008 - 08/05/2008 Chemotherapy    Taxotere Adriamycin and Cytoxan for a total of 6 cycles       10/02/2008 - 10/15/2008 Radiation Therapy    Adj XRT      11/04/2008 - 01/13/2009 Anti-estrogen oral therapy    tamoxifen 20 mg daily but this was discontinued      12/08/2008 Surgery    BSO      01/07/2009 -  Anti-estrogen oral therapy    Letrozole      05/03/2009 Surgery    right tram flap breast reconstruction; BRCA 1,2 Neg       CHIEF COMPLIANT: Follow-up on anastrozole  INTERVAL HISTORY: Tina Potter is a 55 year old with above-mentioned history of right breast cancer currently on oral antiestrogen therapy with anastrozole. The plan is to treat her for 10 years and 2010. She is tolerating it extremely well. She denies any hot flashes or myalgias. She denies any pain or discomfort in the breast. She [place] a lot of time [place] on [place].  REVIEW OF SYSTEMS:   Constitutional: Denies fevers, chills or abnormal weight loss Eyes: Denies blurriness of vision Ears, nose, mouth, throat, and face: Denies mucositis or sore throat Respiratory: Denies cough, dyspnea or wheezes Cardiovascular: Denies palpitation, chest discomfort Gastrointestinal:  Denies nausea, heartburn or change in bowel habits Skin: Denies abnormal skin  rashes Lymphatics: Denies new lymphadenopathy or easy bruising Neurological:Denies numbness, tingling or new weaknesses Behavioral/Psych: Mood is stable, no new changes  Extremities: No lower extremity edema Breast:  denies any pain or lumps or nodules in either breasts All other systems were reviewed with the patient and are negative.  I have reviewed the past medical history, past surgical history, social history and family history with the patient and they are unchanged from previous note.  ALLERGIES:  has No Known Allergies.  MEDICATIONS:  Current Outpatient Prescriptions  Medication Sig Dispense Refill  . letrozole (FEMARA) 2.5 MG tablet Take 1 tablet (2.5 mg total) by mouth daily. 90 tablet 3  . UNABLE TO FIND Dispense class II compression sleeve 30-40 mm/hg/ and glove for this patient with history of breast cancer s/p surgery 1 each 0  . venlafaxine XR (EFFEXOR-XR) 37.5 MG 24 hr capsule TAKE ONE CAPSULE EVERY MORNING WITH BREAKFAST 30 capsule 0   No current facility-administered medications for this visit.     PHYSICAL EXAMINATION: ECOG PERFORMANCE STATUS: 0 - Asymptomatic  Vitals:   03/10/16 1537  BP: 117/65  Pulse: 80  Resp: 18  Temp: 98.7 F (37.1 C)   Filed Weights   03/10/16 1537  Weight: 182 lb 4.8 oz (82.7 kg)    GENERAL:alert, no distress and comfortable SKIN: skin color, texture, turgor are normal, no rashes or significant lesions EYES: normal, Conjunctiva are pink and non-injected, sclera clear  OROPHARYNX:no exudate, no erythema and lips, buccal mucosa, and tongue normal  NECK: supple, thyroid normal size, non-tender, without nodularity LYMPH:  no palpable lymphadenopathy in the cervical, axillary or inguinal LUNGS: clear to auscultation and percussion with normal breathing effort HEART: regular rate & rhythm and no murmurs and no lower extremity edema ABDOMEN:abdomen soft, non-tender and normal bowel sounds MUSCULOSKELETAL:no cyanosis of digits and no  clubbing  NEURO: alert & oriented x 3 with fluent speech, no focal motor/sensory deficits EXTREMITIES: No lower extremity edema BREAST: No palpable masses or nodules in either right or left breasts. No palpable axillary supraclavicular or infraclavicular adenopathy no breast tenderness or nipple discharge. (exam performed in the presence of a chaperone)  LABORATORY DATA:  I have reviewed the data as listed   Chemistry      Component Value Date/Time   NA 142 06/03/2014 0906   K 3.8 06/03/2014 0906   CL 105 07/30/2012 0921   CO2 26 06/03/2014 0906   BUN 12.5 06/03/2014 0906   CREATININE 0.7 06/03/2014 0906      Component Value Date/Time   CALCIUM 9.5 06/03/2014 0906   ALKPHOS 58 06/03/2014 0906   AST 20 06/03/2014 0906   ALT 19 06/03/2014 0906   BILITOT 0.38 06/03/2014 0906       Lab Results  Component Value Date   WBC 5.1 06/03/2014   HGB 13.4 06/03/2014   HCT 40.6 06/03/2014   MCV 91.1 06/03/2014   PLT 238 06/03/2014   NEUTROABS 2.6 06/03/2014     ASSESSMENT & PLAN:  Breast cancer of lower-outer quadrant of right female breast Right breast cancer stage IIB status post right modified radical mastectomy with axillary lymph node dissection September 2009 treated with adjuvant chemotherapy with TAC x6 cycles and post mastectomy radiation completed 10/15/2008, given tamoxifen from 2007 to 2010 and underwent bilateral salpingo-oophorectomies and start omeprazole 01/14/2009, undergone TRAM flap reconstruction.  Aromatase inhibitor toxicities: Plan is 10 years 1. Osteopenia bone density done in November 2015 T score -1.3: I encouraged her to take vitamin D.  Breast Cancer Surveillance: 1. Breast exam 03/10/2016: Normal 2. Mammogram 05/28/2015 benign  RTC in 1 year for follow up   No orders of the defined types were placed in this encounter.  The patient has a good understanding of the overall plan. she agrees with it. she will call with any problems that may develop  before the next visit here.   Rulon Eisenmenger, MD 03/10/16

## 2016-03-10 NOTE — Assessment & Plan Note (Signed)
Right breast cancer stage IIB status post right modified radical mastectomy with axillary lymph node dissection September 2009 treated with adjuvant chemotherapy with TAC x6 cycles and post mastectomy radiation completed 10/15/2008, given tamoxifen from 2007 to 2010 and underwent bilateral salpingo-oophorectomies and start omeprazole 01/14/2009, undergone TRAM flap reconstruction.  Aromatase inhibitor toxicities: Plan is 10 years 1. Osteopenia bone density done in November 2015 T score -1.3: I encouraged her to take vitamin D.  Breast Cancer Surveillance: 1. Breast exam 03/10/2016: Normal 2. Mammogram 05/28/2015 benign  RTC in 1 year for follow up

## 2016-04-27 ENCOUNTER — Other Ambulatory Visit: Payer: Self-pay | Admitting: Hematology and Oncology

## 2016-04-27 DIAGNOSIS — Z1231 Encounter for screening mammogram for malignant neoplasm of breast: Secondary | ICD-10-CM

## 2016-05-06 ENCOUNTER — Other Ambulatory Visit: Payer: Self-pay | Admitting: Hematology and Oncology

## 2016-05-30 ENCOUNTER — Ambulatory Visit
Admission: RE | Admit: 2016-05-30 | Discharge: 2016-05-30 | Disposition: A | Discharge status: Home / Self Care | Payer: BLUE CROSS/BLUE SHIELD | Source: Ambulatory Visit | Attending: Hematology and Oncology | Admitting: Hematology and Oncology

## 2016-05-30 DIAGNOSIS — Z1231 Encounter for screening mammogram for malignant neoplasm of breast: Secondary | ICD-10-CM

## 2016-06-13 ENCOUNTER — Other Ambulatory Visit: Payer: Self-pay | Admitting: Hematology and Oncology

## 2016-08-16 ENCOUNTER — Other Ambulatory Visit: Payer: Self-pay

## 2016-08-16 MED ORDER — VENLAFAXINE HCL ER 37.5 MG PO CP24: ORAL_CAPSULE | ORAL | 1 refills | Status: DC

## 2016-10-25 ENCOUNTER — Other Ambulatory Visit: Payer: Self-pay | Admitting: Emergency Medicine

## 2016-10-25 MED ORDER — VENLAFAXINE HCL ER 37.5 MG PO CP24: ORAL_CAPSULE | ORAL | 6 refills | Status: DC

## 2017-03-13 ENCOUNTER — Telehealth: Payer: Self-pay | Admitting: Hematology and Oncology

## 2017-03-13 ENCOUNTER — Ambulatory Visit (HOSPITAL_BASED_OUTPATIENT_CLINIC_OR_DEPARTMENT_OTHER): Payer: BLUE CROSS/BLUE SHIELD | Admitting: Hematology and Oncology

## 2017-03-13 DIAGNOSIS — M8588 Other specified disorders of bone density and structure, other site: Secondary | ICD-10-CM | POA: Diagnosis not present

## 2017-03-13 DIAGNOSIS — C50511 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 female breast: Secondary | ICD-10-CM

## 2017-03-13 DIAGNOSIS — Z170 Estrogen receptor positive status [ER+]: Secondary | ICD-10-CM

## 2017-03-13 MED ORDER — LETROZOLE 2.5 MG PO TABS: 2.5000 mg | ORAL_TABLET | Freq: Every day | ORAL | 3 refills | Status: DC

## 2017-03-13 MED ORDER — VENLAFAXINE HCL ER 37.5 MG PO CP24: ORAL_CAPSULE | ORAL | 3 refills | Status: DC

## 2017-03-13 NOTE — Assessment & Plan Note (Signed)
Right breast cancer stage IIB status post right modified radical mastectomy with axillary lymph node dissection September 2009 treated with adjuvant chemotherapy with TAC x6 cycles and post mastectomy radiation completed 10/15/2008, given tamoxifen from 2007 to 2010 and underwent bilateral salpingo-oophorectomies and start omeprazole 01/14/2009, undergone TRAM flap reconstruction.  Aromatase inhibitor toxicities: Plan is 10 years 1. Osteopenia bone density done in November 2015 T score -1.3: I encouraged her to take vitamin D.  Breast Cancer Surveillance: 1. Breast exam 03/10/2016: Normal 2. Mammogram 05/28/2015 benign

## 2017-03-13 NOTE — Progress Notes (Signed)
Patient Care Team: Erroll Luna, MD as PCP - General (General Surgery)  DIAGNOSIS:  Encounter Diagnosis  Name Primary?  . 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 breast of female, estrogen receptor positive ([place])     SUMMARY OF ONCOLOGIC HISTORY: Oncology History   Tamoxifen 20 mg daily stopped     Breast cancer of lower-outer quadrant of right female breast ([place])   03/17/2008 Surgery    right modified radical mastectomy with excellent lymph node dissection for a stage IIB right breast cancer      04/22/2008 - 08/05/2008 Chemotherapy    Taxotere Adriamycin and Cytoxan for a total of 6 cycles       10/02/2008 - 10/15/2008 Radiation Therapy    Adj XRT      11/04/2008 - 01/13/2009 Anti-estrogen oral therapy    tamoxifen 20 mg daily but this was discontinued      12/08/2008 Surgery    BSO      01/07/2009 -  Anti-estrogen oral therapy    Letrozole      05/03/2009 Surgery    right tram flap breast reconstruction; BRCA 1,2 Neg       CHIEF COMPLIANT: Follow-up on letrozole therapy  INTERVAL HISTORY: Tina Potter is a 56 year old with above-mentioned history of right breast cancer who is currently on letrozole therapy. She is tolerating it well. She does not have any hot flashes or myalgias. She plans to take letrozole for 10 years. She denies any lumps or nodules in breast.  REVIEW OF SYSTEMS:   Constitutional: Denies fevers, chills or abnormal weight loss Eyes: Denies blurriness of vision Ears, nose, mouth, throat, and face: Denies mucositis or sore throat Respiratory: Denies cough, dyspnea or wheezes Cardiovascular: Denies palpitation, chest discomfort Gastrointestinal:  Denies nausea, heartburn or change in bowel habits Skin: Denies abnormal skin rashes Lymphatics: Denies new lymphadenopathy or easy bruising Neurological:Denies numbness, tingling or new weaknesses Behavioral/Psych: Mood is stable, no new changes  Extremities: No lower extremity  edema Breast:  denies any pain or lumps or nodules in either breasts All other systems were reviewed with the patient and are negative.  I have reviewed the past medical history, past surgical history, social history and family history with the patient and they are unchanged from previous note.  ALLERGIES:  has No Known Allergies.  MEDICATIONS:  Current Outpatient Prescriptions  Medication Sig Dispense Refill  . letrozole (FEMARA) 2.5 MG tablet Take 1 tablet (2.5 mg total) by mouth daily. 90 tablet 3  . UNABLE TO FIND Dispense class II compression sleeve 30-40 mm/hg/ and glove for this patient with history of breast cancer s/p surgery 1 each 0  . venlafaxine XR (EFFEXOR-XR) 37.5 MG 24 hr capsule TAKE ONE CAPSULE EVERY MORNING WITH BREAKFAST 90 capsule 3   No current facility-administered medications for this visit.     PHYSICAL EXAMINATION: ECOG PERFORMANCE STATUS: 0 - Asymptomatic  Vitals:   03/13/17 1524  BP: 129/72  Pulse: 82  Resp: 20  Temp: 98.1 F (36.7 C)  SpO2: 97%   Filed Weights   03/13/17 1524  Weight: 183 lb (83 kg)    GENERAL:alert, no distress and comfortable SKIN: skin color, texture, turgor are normal, no rashes or significant lesions EYES: normal, Conjunctiva are pink and non-injected, sclera clear OROPHARYNX:no exudate, no erythema and lips, buccal mucosa, and tongue normal  NECK: supple, thyroid normal size, non-tender, without nodularity LYMPH:  no palpable lymphadenopathy in the cervical, axillary or inguinal LUNGS: clear to auscultation and percussion  with normal breathing effort HEART: regular rate & rhythm and no murmurs and no lower extremity edema ABDOMEN:abdomen soft, non-tender and normal bowel sounds MUSCULOSKELETAL:no cyanosis of digits and no clubbing  NEURO: alert & oriented x 3 with fluent speech, no focal motor/sensory deficits EXTREMITIES: No lower extremity edema BREAST: No palpable masses or nodules in either right or left breasts.  No palpable axillary supraclavicular or infraclavicular adenopathy no breast tenderness or nipple discharge. (exam performed in the presence of a chaperone)  LABORATORY DATA:  I have reviewed the data as listed   Chemistry      Component Value Date/Time   NA 142 06/03/2014 0906   K 3.8 06/03/2014 0906   CL 105 07/30/2012 0921   CO2 26 06/03/2014 0906   BUN 12.5 06/03/2014 0906   CREATININE 0.7 06/03/2014 0906      Component Value Date/Time   CALCIUM 9.5 06/03/2014 0906   ALKPHOS 58 06/03/2014 0906   AST 20 06/03/2014 0906   ALT 19 06/03/2014 0906   BILITOT 0.38 06/03/2014 0906       Lab Results  Component Value Date   WBC 5.1 06/03/2014   HGB 13.4 06/03/2014   HCT 40.6 06/03/2014   MCV 91.1 06/03/2014   PLT 238 06/03/2014   NEUTROABS 2.6 06/03/2014    ASSESSMENT & PLAN:  Breast cancer of lower-outer quadrant of right female breast Right breast cancer stage IIB status post right modified radical mastectomy with axillary lymph node dissection September 2009 treated with adjuvant chemotherapy with TAC x6 cycles and post mastectomy radiation completed 10/15/2008, given tamoxifen from 2007 to 2010 and underwent bilateral salpingo-oophorectomies and start omeprazole 01/14/2009, undergone TRAM flap reconstruction.  Aromatase inhibitor toxicities: Plan is 10 years 1. Osteopenia bone density done in November 2015 T score -1.3: I encouraged her to take vitamin D.  Breast Cancer Surveillance: 1. Breast exam 03/10/2016: Normal 2. Mammogram 05/28/2015 benign Return to clinic in 1 year for follow-up   I spent 15 minutes talking to the patient of which more than half was spent in counseling and coordination of care.  No orders of the defined types were placed in this encounter.  The patient has a good understanding of the overall plan. she agrees with it. she will call with any problems that may develop before the next visit here.   Rulon Eisenmenger, MD 03/13/17

## 2017-03-13 NOTE — Telephone Encounter (Signed)
Scheduled appts per 9/17 los. Patient did not want avs or calendar.  °

## 2017-04-28 ENCOUNTER — Other Ambulatory Visit: Payer: Self-pay | Admitting: Hematology and Oncology

## 2017-04-28 DIAGNOSIS — Z1231 Encounter for screening mammogram for malignant neoplasm of breast: Secondary | ICD-10-CM

## 2017-06-06 ENCOUNTER — Ambulatory Visit: Payer: BLUE CROSS/BLUE SHIELD

## 2017-07-06 ENCOUNTER — Ambulatory Visit
Admission: RE | Admit: 2017-07-06 | Discharge: 2017-07-06 | Disposition: A | Discharge status: Home / Self Care | Payer: BLUE CROSS/BLUE SHIELD | Source: Ambulatory Visit | Attending: Hematology and Oncology | Admitting: Hematology and Oncology

## 2017-07-06 DIAGNOSIS — Z1231 Encounter for screening mammogram for malignant neoplasm of breast: Secondary | ICD-10-CM

## 2017-07-06 HISTORY — DX: Personal history of irradiation: Z92.3

## 2017-07-06 HISTORY — DX: Personal history of antineoplastic chemotherapy: Z92.21

## 2018-03-19 ENCOUNTER — Telehealth: Payer: Self-pay | Admitting: Hematology and Oncology

## 2018-03-19 ENCOUNTER — Inpatient Hospital Stay: Payer: BLUE CROSS/BLUE SHIELD | Attending: Hematology and Oncology | Admitting: Hematology and Oncology

## 2018-03-19 DIAGNOSIS — Z170 Estrogen receptor positive status [ER+]: Secondary | ICD-10-CM | POA: Diagnosis not present

## 2018-03-19 DIAGNOSIS — M8580 Other specified disorders of bone density and structure, unspecified site: Secondary | ICD-10-CM | POA: Insufficient documentation

## 2018-03-19 DIAGNOSIS — Z9011 Acquired absence of right breast and nipple: Secondary | ICD-10-CM | POA: Insufficient documentation

## 2018-03-19 DIAGNOSIS — C50511 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 female breast: Secondary | ICD-10-CM | POA: Insufficient documentation

## 2018-03-19 DIAGNOSIS — Z923 Personal history of irradiation: Secondary | ICD-10-CM | POA: Diagnosis not present

## 2018-03-19 DIAGNOSIS — Z90722 Acquired absence of ovaries, bilateral: Secondary | ICD-10-CM | POA: Insufficient documentation

## 2018-03-19 DIAGNOSIS — Z9221 Personal history of antineoplastic chemotherapy: Secondary | ICD-10-CM | POA: Diagnosis not present

## 2018-03-19 DIAGNOSIS — Z79811 Long term (current) use of aromatase inhibitors: Secondary | ICD-10-CM | POA: Diagnosis not present

## 2018-03-19 MED ORDER — LETROZOLE 2.5 MG PO TABS: 2.5000 mg | ORAL_TABLET | Freq: Every day | ORAL | 3 refills | Status: DC

## 2018-03-19 NOTE — Progress Notes (Signed)
Patient Care Team: Patient, No Pcp Per as PCP - General (General Practice)  DIAGNOSIS:  Encounter Diagnosis  Name Primary?  . 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 breast of female, estrogen receptor positive ([place])     SUMMARY OF ONCOLOGIC HISTORY: Oncology History   Tamoxifen 20 mg daily stopped     Breast cancer of lower-outer quadrant of right female breast ([place])   03/17/2008 Surgery    right modified radical mastectomy with excellent lymph node dissection for a stage IIB right breast cancer    04/22/2008 - 08/05/2008 Chemotherapy    Taxotere Adriamycin and Cytoxan for a total of 6 cycles     10/02/2008 - 10/15/2008 Radiation Therapy    Adj XRT    11/04/2008 - 01/13/2009 Anti-estrogen oral therapy    tamoxifen 20 mg daily but this was discontinued    12/08/2008 Surgery    BSO    01/07/2009 -  Anti-estrogen oral therapy    Letrozole    05/03/2009 Surgery    right tram flap breast reconstruction; BRCA 1,2 Neg     CHIEF COMPLIANT: Surveillance of breast cancer  INTERVAL HISTORY: Oakleigh H Fiebelkorn is a 57 year old with above-mentioned his right breast cancer treated with right mastectomy and reconstruction she got adjuvant chemotherapy and radiation followed by tamoxifen.  She could not tolerate it.  She underwent bilateral salpingo-nephrectomy.  She has been on letrozole therapy since 2010.  She completed 9 years of therapy.  She wants to stay on it until 10 years.  REVIEW OF SYSTEMS:   Constitutional: Denies fevers, chills or abnormal weight loss Eyes: Denies blurriness of vision Ears, nose, mouth, throat, and face: Denies mucositis or sore throat Respiratory: Denies cough, dyspnea or wheezes Cardiovascular: Denies palpitation, chest discomfort Gastrointestinal:  Denies nausea, heartburn or change in bowel habits Skin: Denies abnormal skin rashes Lymphatics: Denies new lymphadenopathy or easy bruising Neurological:Denies numbness, tingling or new  weaknesses Behavioral/Psych: Mood is stable, no new changes  Extremities: No lower extremity edema Breast:  denies any pain or lumps or nodules in either breasts All other systems were reviewed with the patient and are negative.  I have reviewed the past medical history, past surgical history, social history and family history with the patient and they are unchanged from previous note.  ALLERGIES:  has No Known Allergies.  MEDICATIONS:  Current Outpatient Medications  Medication Sig Dispense Refill  . letrozole (FEMARA) 2.5 MG tablet Take 1 tablet (2.5 mg total) by mouth daily. 90 tablet 3  . UNABLE TO FIND Dispense class II compression sleeve 30-40 mm/hg/ and glove for this patient with history of breast cancer s/p surgery 1 each 0  . venlafaxine XR (EFFEXOR-XR) 37.5 MG 24 hr capsule TAKE ONE CAPSULE EVERY MORNING WITH BREAKFAST 90 capsule 3   No current facility-administered medications for this visit.     PHYSICAL EXAMINATION: ECOG PERFORMANCE STATUS: 0 - Asymptomatic  Vitals:   03/19/18 1357  BP: 123/74  Pulse: 67  Resp: 18  Temp: 98.4 F (36.9 C)  SpO2: 97%   Filed Weights   03/19/18 1357  Weight: 194 lb 12.8 oz (88.4 kg)    GENERAL:alert, no distress and comfortable SKIN: skin color, texture, turgor are normal, no rashes or significant lesions EYES: normal, Conjunctiva are pink and non-injected, sclera clear OROPHARYNX:no exudate, no erythema and lips, buccal mucosa, and tongue normal  NECK: supple, thyroid normal size, non-tender, without nodularity LYMPH:  no palpable lymphadenopathy in the cervical, axillary or inguinal LUNGS: clear  to auscultation and percussion with normal breathing effort HEART: regular rate & rhythm and no murmurs and no lower extremity edema ABDOMEN:abdomen soft, non-tender and normal bowel sounds MUSCULOSKELETAL:no cyanosis of digits and no clubbing  NEURO: alert & oriented x 3 with fluent speech, no focal motor/sensory  deficits EXTREMITIES: No lower extremity edema BREAST: No palpable masses or nodules in either right or left breasts. No palpable axillary supraclavicular or infraclavicular adenopathy no breast tenderness or nipple discharge. (exam performed in the presence of a chaperone)  LABORATORY DATA:  I have reviewed the data as listed CMP Latest Ref Rng & Units 06/03/2014 08/05/2013 01/31/2013  Glucose 70 - 140 mg/dl 90 87 96  BUN 7.0 - 26.0 mg/dL 12.5 16.4 12.2  Creatinine 0.6 - 1.1 mg/dL 0.7 0.7 0.8  Sodium 136 - 145 mEq/L 142 142 144  Potassium 3.5 - 5.1 mEq/L 3.8 3.9 4.0  Chloride 98 - 107 mEq/L - - -  CO2 22 - 29 mEq/L 26 28 28   Calcium 8.4 - 10.4 mg/dL 9.5 9.7 10.0  Total Protein 6.4 - 8.3 g/dL 7.1 7.3 7.6  Total Bilirubin 0.20 - 1.20 mg/dL 0.38 0.32 0.29  Alkaline Phos 40 - 150 U/L 58 54 56  AST 5 - 34 U/L 20 21 23   ALT 0 - 55 U/L 19 22 18     Lab Results  Component Value Date   WBC 5.1 06/03/2014   HGB 13.4 06/03/2014   HCT 40.6 06/03/2014   MCV 91.1 06/03/2014   PLT 238 06/03/2014   NEUTROABS 2.6 06/03/2014    ASSESSMENT & PLAN:  Breast cancer of lower-outer quadrant of right female breast Right breast cancer stage IIB status post right modified radical mastectomy with axillary lymph node dissection September 2009 treated with adjuvant chemotherapy with TAC x6 cycles and post mastectomy radiation completed 10/15/2008, given tamoxifen from 2007 to 2010 and underwent bilateral salpingo-oophorectomies and start omeprazole 01/14/2009, undergone TRAM flap reconstruction.  Aromatase inhibitor toxicities: Plan is 10 years 1. Osteopenia bone density done in November 2015 T score -1.3: I encouraged her to take vitamin D.  Breast Cancer Surveillance: 1. Breast exam 03/19/2018: Normal 2. Mammogram 07/06/2017 benign, breast density category C  Return to clinic in 1 year for follow-up     No orders of the defined types were placed in this encounter.  The patient has a good  understanding of the overall plan. she agrees with it. she will call with any problems that may develop before the next visit here.   Harriette Ohara, MD 03/19/18

## 2018-03-19 NOTE — Telephone Encounter (Signed)
Gave pt avs and calendar  °

## 2018-03-19 NOTE — Assessment & Plan Note (Signed)
Right breast cancer stage IIB status post right modified radical mastectomy with axillary lymph node dissection September 2009 treated with adjuvant chemotherapy with TAC x6 cycles and post mastectomy radiation completed 10/15/2008, given tamoxifen from 2007 to 2010 and underwent bilateral salpingo-oophorectomies and start omeprazole 01/14/2009, undergone TRAM flap reconstruction.  Aromatase inhibitor toxicities: Plan is 10 years 1. Osteopenia bone density done in November 2015 T score -1.3: I encouraged her to take vitamin D.  Breast Cancer Surveillance: 1. Breast exam 03/19/2018: Normal 2. Mammogram 07/06/2017 benign, breast density category C  Return to clinic in 1 year for follow-up

## 2018-06-11 ENCOUNTER — Other Ambulatory Visit: Payer: Self-pay | Admitting: Hematology and Oncology

## 2018-06-11 DIAGNOSIS — Z1231 Encounter for screening mammogram for malignant neoplasm of breast: Secondary | ICD-10-CM

## 2018-06-21 ENCOUNTER — Other Ambulatory Visit: Payer: Self-pay

## 2018-06-21 MED ORDER — VENLAFAXINE HCL ER 37.5 MG PO CP24: ORAL_CAPSULE | ORAL | 0 refills | Status: DC

## 2018-06-21 NOTE — Telephone Encounter (Signed)
Refill sent.  Patient ware.

## 2018-06-21 NOTE — Telephone Encounter (Signed)
Returned patient's call.  Patient requesting refill on Effexor.

## 2018-08-03 ENCOUNTER — Ambulatory Visit
Admission: RE | Admit: 2018-08-03 | Discharge: 2018-08-03 | Disposition: A | Discharge status: Home / Self Care | Payer: BLUE CROSS/BLUE SHIELD | Source: Ambulatory Visit | Attending: Hematology and Oncology | Admitting: Hematology and Oncology

## 2018-08-03 DIAGNOSIS — Z170 Estrogen receptor positive status [ER+]: Principal | ICD-10-CM

## 2018-08-03 DIAGNOSIS — Z1231 Encounter for screening mammogram for malignant neoplasm of breast: Secondary | ICD-10-CM

## 2018-08-03 DIAGNOSIS — C50511 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 female breast: Secondary | ICD-10-CM

## 2018-10-18 ENCOUNTER — Other Ambulatory Visit: Payer: Self-pay | Admitting: Hematology and Oncology

## 2018-10-18 ENCOUNTER — Other Ambulatory Visit: Payer: Self-pay | Admitting: *Deleted

## 2018-10-18 MED ORDER — VENLAFAXINE HCL ER 37.5 MG PO CP24: ORAL_CAPSULE | ORAL | 0 refills | Status: DC

## 2019-01-12 ENCOUNTER — Other Ambulatory Visit: Payer: Self-pay | Admitting: Hematology and Oncology

## 2019-03-06 ENCOUNTER — Other Ambulatory Visit: Payer: Self-pay | Admitting: Hematology and Oncology

## 2019-03-06 DIAGNOSIS — C50511 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 female breast: Secondary | ICD-10-CM

## 2019-03-24 NOTE — Progress Notes (Signed)
Patient Care Team: Patient, No Pcp Per as PCP - General (General Practice)  DIAGNOSIS:    ICD-10-CM   1. 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 breast of female, estrogen receptor positive ([place])  C50.511    Z17.0     SUMMARY OF ONCOLOGIC HISTORY: Oncology History Overview Note  Tamoxifen 20 mg daily stopped   Breast cancer of lower-outer quadrant of right female breast ([place])  03/17/2008 Surgery   right modified radical mastectomy with excellent lymph node dissection for a stage IIB right breast cancer   04/22/2008 - 08/05/2008 Chemotherapy   Taxotere Adriamycin and Cytoxan for a total of 6 cycles    10/02/2008 - 10/15/2008 Radiation Therapy   Adj XRT   11/04/2008 - 01/13/2009 Anti-estrogen oral therapy   tamoxifen 20 mg daily but this was discontinued   12/08/2008 Surgery   BSO   01/07/2009 -  Anti-estrogen oral therapy   Letrozole, plan for 10 years   05/03/2009 Surgery   right tram flap breast reconstruction; BRCA 1,2 Neg     CHIEF COMPLIANT: Follow-up of right breast cancer on letrozole  INTERVAL HISTORY: Tina Potter is a 58 y.o. with above-mentioned history of right breast cancer treated with right mastectomy and reconstruction, adjuvant chemotherapy, radiation, and who is currently on anti-estrogen therapy with letrozole. I last saw her a year ago. Mammogram on 08/03/18 showed no evidence of malignancy in the left breast. Bone density scan on 08/03/18 showed osteopenia with a T-score of -1.3. She presents to the clinic today for annual follow-up.    REVIEW OF SYSTEMS:   Constitutional: Denies fevers, chills or abnormal weight loss Eyes: Denies blurriness of vision Ears, nose, mouth, throat, and face: Denies mucositis or sore throat Respiratory: Denies cough, dyspnea or wheezes Cardiovascular: Denies palpitation, chest discomfort Gastrointestinal: Denies nausea, heartburn or change in bowel habits Skin: Denies abnormal skin rashes Lymphatics: Denies new  lymphadenopathy or easy bruising Neurological: Denies numbness, tingling or new weaknesses Behavioral/Psych: Mood is stable, no new changes  Extremities: No lower extremity edema Breast: denies any pain or lumps or nodules in either breasts All other systems were reviewed with the patient and are negative.  I have reviewed the past medical history, past surgical history, social history and family history with the patient and they are unchanged from previous note.  ALLERGIES:  has No Known Allergies.  MEDICATIONS:  Current Outpatient Medications  Medication Sig Dispense Refill  . venlafaxine XR (EFFEXOR-XR) 37.5 MG 24 hr capsule TAKE 1 CAPSULE BY MOUTH EVERY MORNING 90 capsule 3   No current facility-administered medications for this visit.     PHYSICAL EXAMINATION: ECOG PERFORMANCE STATUS: 0 - Asymptomatic  Vitals:   03/25/19 1424  BP: 136/74  Pulse: 69  Resp: 17  Temp: 98.5 F (36.9 C)  SpO2: 98%   Filed Weights   03/25/19 1424  Weight: 197 lb 9.6 oz (89.6 kg)    GENERAL: alert, no distress and comfortable SKIN: skin color, texture, turgor are normal, no rashes or significant lesions EYES: normal, Conjunctiva are pink and non-injected, sclera clear OROPHARYNX: no exudate, no erythema and lips, buccal mucosa, and tongue normal  NECK: supple, thyroid normal size, non-tender, without nodularity LYMPH: no palpable lymphadenopathy in the cervical, axillary or inguinal LUNGS: clear to auscultation and percussion with normal breathing effort HEART: regular rate & rhythm and no murmurs and no lower extremity edema ABDOMEN: abdomen soft, non-tender and normal bowel sounds MUSCULOSKELETAL: no cyanosis of digits and no clubbing  NEURO:  alert & oriented x 3 with fluent speech, no focal motor/sensory deficits EXTREMITIES: No lower extremity edema BREAST: No palpable masses or nodules in either right or left breasts.  Right breast has been reconstructed.  No palpable axillary  supraclavicular or infraclavicular adenopathy no breast tenderness or nipple discharge. (exam performed in the presence of a chaperone)  LABORATORY DATA:  I have reviewed the data as listed CMP Latest Ref Rng & Units 06/03/2014 08/05/2013 01/31/2013  Glucose 70 - 140 mg/dl 90 87 96  BUN 7.0 - 26.0 mg/dL 12.5 16.4 12.2  Creatinine 0.6 - 1.1 mg/dL 0.7 0.7 0.8  Sodium 136 - 145 mEq/L 142 142 144  Potassium 3.5 - 5.1 mEq/L 3.8 3.9 4.0  Chloride 98 - 107 mEq/L - - -  CO2 22 - 29 mEq/L 26 28 28   Calcium 8.4 - 10.4 mg/dL 9.5 9.7 10.0  Total Protein 6.4 - 8.3 g/dL 7.1 7.3 7.6  Total Bilirubin 0.20 - 1.20 mg/dL 0.38 0.32 0.29  Alkaline Phos 40 - 150 U/L 58 54 56  AST 5 - 34 U/L 20 21 23   ALT 0 - 55 U/L 19 22 18     Lab Results  Component Value Date   WBC 5.1 06/03/2014   HGB 13.4 06/03/2014   HCT 40.6 06/03/2014   MCV 91.1 06/03/2014   PLT 238 06/03/2014   NEUTROABS 2.6 06/03/2014    ASSESSMENT & PLAN:  Breast cancer of lower-outer quadrant of right female breast Right breast cancer stage IIB status post right modified radical mastectomy with axillary lymph node dissection September 2009 treated with adjuvant chemotherapy with TAC x6 cycles and post mastectomy radiation completed 10/15/2008, given tamoxifen from 2007 to 2010 and underwent bilateral salpingo-oophorectomies and start omeprazole 01/14/2009, undergone TRAM flap reconstruction.  Aromatase inhibitor toxicities: Completed 10 years, discontinuing as of 03/25/2019 1. Osteopenia bone density done in November 2015 T score -1.3: Repeat bone density 08/03/2018: T score -1.3 unchanged I encouraged her to take vitamin D. We discussed the importance of exercise and watching her weight.  Her BMI is over 30.  Our goal is to bring it down to below 30 by next year.  Breast Cancer Surveillance: 1. Breast exam  03/25/2019: Normal 2. Mammogram  08/03/2018 benign, breast density category C  Return to clinic in 1 year for follow-up  No orders of  the defined types were placed in this encounter.  The patient has a good understanding of the overall plan. she agrees with it. she will call with any problems that may develop before the next visit here.  Nicholas Lose, MD 03/25/2019  Julious Oka Dorshimer am acting as scribe for Dr. Nicholas Lose.  I have reviewed the above documentation for accuracy and completeness, and I agree with the above.

## 2019-03-25 ENCOUNTER — Inpatient Hospital Stay: Payer: BC Managed Care – PPO | Attending: Hematology and Oncology | Admitting: Hematology and Oncology

## 2019-03-25 ENCOUNTER — Other Ambulatory Visit: Payer: Self-pay

## 2019-03-25 DIAGNOSIS — Z79899 Other long term (current) drug therapy: Secondary | ICD-10-CM | POA: Insufficient documentation

## 2019-03-25 DIAGNOSIS — Z923 Personal history of irradiation: Secondary | ICD-10-CM | POA: Insufficient documentation

## 2019-03-25 DIAGNOSIS — C50511 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 female breast: Secondary | ICD-10-CM | POA: Diagnosis present

## 2019-03-25 DIAGNOSIS — Z9221 Personal history of antineoplastic chemotherapy: Secondary | ICD-10-CM | POA: Diagnosis not present

## 2019-03-25 DIAGNOSIS — Z9011 Acquired absence of right breast and nipple: Secondary | ICD-10-CM | POA: Insufficient documentation

## 2019-03-25 DIAGNOSIS — Z170 Estrogen receptor positive status [ER+]: Secondary | ICD-10-CM | POA: Insufficient documentation

## 2019-03-25 DIAGNOSIS — M8580 Other specified disorders of bone density and structure, unspecified site: Secondary | ICD-10-CM | POA: Diagnosis not present

## 2019-03-25 DIAGNOSIS — Z79811 Long term (current) use of aromatase inhibitors: Secondary | ICD-10-CM | POA: Diagnosis not present

## 2019-03-25 MED ORDER — VENLAFAXINE HCL ER 37.5 MG PO CP24: ORAL_CAPSULE | ORAL | 3 refills | Status: DC

## 2019-03-25 NOTE — Assessment & Plan Note (Signed)
Right breast cancer stage IIB status post right modified radical mastectomy with axillary lymph node dissection September 2009 treated with adjuvant chemotherapy with TAC x6 cycles and post mastectomy radiation completed 10/15/2008, given tamoxifen from 2007 to 2010 and underwent bilateral salpingo-oophorectomies and start omeprazole 01/14/2009, undergone TRAM flap reconstruction.  Aromatase inhibitor toxicities: Plan is 10 years 1. Osteopenia bone density done in November 2015 T score -1.3: Repeat bone density 08/03/2018: T score -1.3 unchanged I encouraged her to take vitamin D.  Breast Cancer Surveillance: 1. Breast exam  03/25/2019: Normal 2. Mammogram  08/03/2018 benign, breast density category C  Return to clinic in 1 year for follow-up

## 2019-03-27 ENCOUNTER — Telehealth: Payer: Self-pay | Admitting: Hematology and Oncology

## 2019-03-27 NOTE — Telephone Encounter (Signed)
I will mail schedule

## 2019-07-30 ENCOUNTER — Other Ambulatory Visit: Payer: Self-pay | Admitting: Hematology and Oncology

## 2019-07-30 DIAGNOSIS — Z1231 Encounter for screening mammogram for malignant neoplasm of breast: Secondary | ICD-10-CM

## 2019-09-05 ENCOUNTER — Other Ambulatory Visit: Payer: Self-pay | Admitting: Hematology and Oncology

## 2019-09-05 ENCOUNTER — Ambulatory Visit
Admission: RE | Admit: 2019-09-05 | Discharge: 2019-09-05 | Disposition: A | Discharge status: Home / Self Care | Payer: BC Managed Care – PPO | Source: Ambulatory Visit | Attending: Hematology and Oncology | Admitting: Hematology and Oncology

## 2019-09-05 ENCOUNTER — Other Ambulatory Visit: Payer: Self-pay

## 2019-09-05 DIAGNOSIS — Z1231 Encounter for screening mammogram for malignant neoplasm of breast: Secondary | ICD-10-CM

## 2019-09-10 ENCOUNTER — Other Ambulatory Visit: Payer: Self-pay | Admitting: *Deleted

## 2019-09-10 DIAGNOSIS — C50511 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 female breast: Secondary | ICD-10-CM

## 2019-09-10 NOTE — Progress Notes (Signed)
Pt called stating she is experiencing right arm swelling and pain x3 days.  Pt states arm is red and warm to the touch.  Pt denies any recent injury or trauma. Per MD pt to have Vas [place] to rule out DVT.  Orders placed and [place] scheduled for tomorrow morning per pt request.  Pt will also be assessed by Sandi Mealy, PA in [facility] post ultrasound.  Pt notified of apts and verbalized understanding.

## 2019-09-11 ENCOUNTER — Ambulatory Visit (HOSPITAL_COMMUNITY)
Admission: RE | Admit: 2019-09-11 | Discharge: 2019-09-11 | Disposition: A | Discharge status: Home / Self Care | Payer: BC Managed Care – PPO | Source: Ambulatory Visit | Attending: Hematology and Oncology | Admitting: Hematology and Oncology

## 2019-09-11 ENCOUNTER — Encounter: Payer: Self-pay | Admitting: Medical

## 2019-09-11 ENCOUNTER — Other Ambulatory Visit: Payer: Self-pay

## 2019-09-11 ENCOUNTER — Inpatient Hospital Stay: Payer: BC Managed Care – PPO | Attending: Medical | Admitting: Medical

## 2019-09-11 VITALS — BP 123/70 | HR 89 | Temp 98.2°F | Resp 18 | Ht 67.0 in | Wt 201.7 lb

## 2019-09-11 DIAGNOSIS — I890 Lymphedema, not elsewhere classified: Secondary | ICD-10-CM | POA: Diagnosis present

## 2019-09-11 DIAGNOSIS — Z9011 Acquired absence of right breast and nipple: Secondary | ICD-10-CM | POA: Insufficient documentation

## 2019-09-11 DIAGNOSIS — Z923 Personal history of irradiation: Secondary | ICD-10-CM | POA: Diagnosis not present

## 2019-09-11 DIAGNOSIS — C50511 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 female breast: Secondary | ICD-10-CM | POA: Insufficient documentation

## 2019-09-11 DIAGNOSIS — Z79811 Long term (current) use of aromatase inhibitors: Secondary | ICD-10-CM | POA: Diagnosis not present

## 2019-09-11 DIAGNOSIS — Z9221 Personal history of antineoplastic chemotherapy: Secondary | ICD-10-CM | POA: Insufficient documentation

## 2019-09-11 DIAGNOSIS — Z170 Estrogen receptor positive status [ER+]: Secondary | ICD-10-CM | POA: Diagnosis not present

## 2019-09-11 DIAGNOSIS — Z809 Family history of malignant neoplasm, unspecified: Secondary | ICD-10-CM | POA: Diagnosis not present

## 2019-09-11 DIAGNOSIS — Z90722 Acquired absence of ovaries, bilateral: Secondary | ICD-10-CM | POA: Insufficient documentation

## 2019-09-11 DIAGNOSIS — M79601 Pain in right arm: Secondary | ICD-10-CM

## 2019-09-11 MED ORDER — SULFAMETHOXAZOLE-TRIMETHOPRIM 800-160 MG PO TABS: 1.0000 | ORAL_TABLET | Freq: Two times a day (BID) | ORAL | 0 refills | Status: DC

## 2019-09-11 NOTE — Progress Notes (Signed)
Upper extremity venous has been completed.   Preliminary results in CV Proc.   Tina Potter 09/11/2019 9:10 AM

## 2019-09-11 NOTE — Progress Notes (Signed)
[facility] Progress Note   Tina Potter [id number] 04/02/1961 59 y.o.  Tina Potter is managed by Dr. 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 Nicholas Lose and is currently treated with letrozole.  She is scheduled to be seen in follow-up on 03/26/2020.  Right arm pain, swelling, and erythema (lymphedema) : The patient was referred for a Doppler ultrasound of her right upper extremity today which returned showing:  Summary:    Right:  No evidence of deep vein thrombosis in the upper extremity. No evidence of superficial vein thrombosis in the upper extremity.   [name] was given a prescription for Bactrim DS p.o. twice daily x7 days.  Additionally a referral was made for the lymphedema clinic.   Please see After Visit Summary for patient specific instructions.  Future Appointments  Date Time Provider [place]  03/26/2020  2:00 PM Nicholas Lose, MD [facility] [address]    Orders Placed This Encounter  Procedures  . Ambulatory referral to Physical Therapy       Subjective:   Patient ID:  [name] is a 59 y.o. (DOB Jul 10, 1960) female.  Chief Complaint: No chief complaint on file.   HPI Tina Potter  Is a 59 y.o. female with a diagnosis of a stage IIIb malignant neoplasm of the right breast.  She continues to be followed by Dr. Nicholas Lose and is currently treated with letrozole.  Her next follow-up appointment is  scheduled for 03/26/2020.  She contacted her office yesterday reporting that she had been having right arm swelling and pain for 3 days.  She had a fever on Sunday of 102.5.  She had fevers and chills over the weekend but has had none since.  Her arm was red and warm to the touch.  She denied trauma or changes in activity.  She was referred for a Doppler ultrasound this morning which returned showing:  Summary:    Right:  No evidence of deep vein thrombosis in the upper extremity. No evidence of superficial vein thrombosis in the upper extremity.    Medications: I have reviewed the patient's current medications.  Allergies: No Known Allergies  Past Medical History:  Diagnosis Date  . Breast cancer ([place])   . Breast cancer ([place]) 08/18/2011  . Lymphedema of arm   . Personal history of chemotherapy 2009  . Personal history of radiation therapy 2009    Past Surgical History:  Procedure Laterality Date  . BILATERAL OOPHORECTOMY  12/2008  . BREAST BIOPSY Right 03/05/2008   x2  . BREAST BIOPSY Left 04/07/2010  . BREAST BIOPSY Left 06/06/2014  . MASTECTOMY Right 2009  . MASTECTOMY, MODIFIED RADICAL W/RECONSTRUCTION  01/2008    Family History  Problem Relation Age of Onset  . Cancer Mother   . Cancer Father     Social History   Socioeconomic History  . Marital status: Married    Spouse name: Not on file  . Number of children: Not on file  . Years of education: Not on file  . Highest  education level: Not on file  Occupational History  . Not on file  Tobacco Use  . Smoking status: Never Smoker  . Smokeless tobacco: Never Used  Substance and Sexual Activity  . Alcohol use: No  . Drug use: No  . Sexual activity: Yes  Other Topics Concern  . Not on file  Social History Narrative  . Not on file   Social Determinants of Health   Financial Resource Strain:   . Difficulty of Paying Living Expenses:   Food Insecurity:   . Worried About Charity fundraiser in the Last Year:    . Arboriculturist in the Last Year:   Transportation Needs:   . Film/video editor (Medical):   Marland Kitchen Lack of Transportation (Non-Medical):   Physical Activity:   . Days of Exercise per Week:   . Minutes of Exercise per Session:   Stress:   . Feeling of Stress :   Social Connections:   . Frequency of Communication with Friends and Family:   . Frequency of Social Gatherings with Friends and Family:   . Attends Religious Services:   . Active Member of Clubs or Organizations:   . Attends Archivist Meetings:   Marland Kitchen Marital Status:   Intimate Partner Violence:   . Fear of Current or Ex-Partner:   . Emotionally Abused:   Marland Kitchen Physically Abused:   . Sexually Abused:     Past Medical History, Surgical history, Social history, and Family history were reviewed and updated as appropriate.   Please see review of systems for further details on the patient's review from today.   Review of Systems:  Review of Systems  Constitutional: Negative for chills, diaphoresis and fever.  HENT: Negative for trouble swallowing and voice change.   Respiratory: Negative for cough, chest tightness, shortness of breath and wheezing.   Cardiovascular: Negative for chest pain and palpitations.  Gastrointestinal: Negative for abdominal pain, constipation, diarrhea, nausea and vomiting.  Musculoskeletal: Negative for back pain and myalgias.  Skin: Positive for color change.       Right arm swelling, warmth, erythema and pain for 3 days.    Neurological: Negative for dizziness, light-headedness and headaches.    Objective:   Physical Exam:  BP 123/70 (BP Location: Left Arm, Patient Position: Sitting)   Pulse 89   Temp 98.2 F (36.8 C) (Temporal)   Resp 18   Ht _0  (1.702 m)   Wt 201 lb 11.2 oz (91.5 kg)   SpO2 98%   BMI 31.59 kg/m  ECOG: 0  Physical Exam Constitutional:      General: She is not in acute distress.    Appearance: She is not diaphoretic.  HENT:     Head: Normocephalic  and atraumatic.  Eyes:     General: No scleral icterus.       Right eye: No discharge.        Left eye: No discharge.     Conjunctiva/sclera: Conjunctivae normal.  Cardiovascular:     Rate and Rhythm: Normal rate and regular rhythm.     Heart sounds: Normal heart sounds. No murmur. No friction rub. No gallop.   Pulmonary:     Effort: Pulmonary effort is normal. No respiratory distress.     Breath sounds: Normal breath sounds. No wheezing or rales.  Skin:    General: Skin is warm and dry.     Findings: Erythema present. No rash.       Neurological:  Mental Status: She is alert.     Coordination: Coordination normal.     Gait: Gait normal.  Psychiatric:        Mood and Affect: Mood normal.        Behavior: Behavior normal.        Thought Content: Thought content normal.        Judgment: Judgment normal.     Lab Review:     Component Value Date/Time   NA 142 06/03/2014 0906   K 3.8 06/03/2014 0906   CL 105 07/30/2012 0921   CO2 26 06/03/2014 0906   GLUCOSE 90 06/03/2014 0906   GLUCOSE 88 07/30/2012 0921   BUN 12.5 06/03/2014 0906   CREATININE 0.7 06/03/2014 0906   CALCIUM 9.5 06/03/2014 0906   PROT 7.1 06/03/2014 0906   ALBUMIN 3.8 06/03/2014 0906   AST 20 06/03/2014 0906   ALT 19 06/03/2014 0906   ALKPHOS 58 06/03/2014 0906   BILITOT 0.38 06/03/2014 0906   GFRNONAA >60 01/06/2009 1057   GFRAA  01/06/2009 1057    >60        The eGFR has been calculated using the MDRD equation. This calculation has not been validated in all clinical situations. eGFR's persistently <60 mL/min signify possible Chronic Kidney Disease.       Component Value Date/Time   WBC 5.1 06/03/2014 0905   WBC 10.0 01/08/2009 0604   RBC 4.46 06/03/2014 0905   RBC 3.49 (L) 01/08/2009 0604   HGB 13.4 06/03/2014 0905   HCT 40.6 06/03/2014 0905   PLT 238 06/03/2014 0905   MCV 91.1 06/03/2014 0905   MCH 30.1 06/03/2014 0905   MCH 30.8 07/14/2010 1110   MCHC 33.0 06/03/2014 0905    MCHC 34.4 01/08/2009 0604   RDW 12.3 06/03/2014 0905   LYMPHSABS 1.8 06/03/2014 0905   MONOABS 0.5 06/03/2014 0905   EOSABS 0.2 06/03/2014 0905   EOSABS 0.4 01/13/2010 1434   BASOSABS 0.0 06/03/2014 0905   -------------------------------  Imaging from last 24 hours (if applicable):  Radiology interpretation: MM Digital Screening Unilat L  Result Date: 09/06/2019 CLINICAL DATA:  Screening. EXAM: DIGITAL SCREENING UNILATERAL LEFT MAMMOGRAM WITH CAD COMPARISON:  Previous exam(s). ACR Breast Density Category c: The breast tissue is heterogeneously dense, which may obscure small masses. FINDINGS: The patient has had a right mastectomy. There are no findings suspicious for malignancy. Images were processed with CAD. IMPRESSION: No mammographic evidence of malignancy. A result letter of this screening mammogram will be mailed directly to the patient. RECOMMENDATION: Screening mammogram in one year.  (Code:SM-L-58M) BI-RADS CATEGORY  1: Negative. Electronically Signed   By: Ammie Ferrier M.D.   On: 09/06/2019 10:28   VAS [place] UPPER EXTREMITY VENOUS DUPLEX  Result Date: 09/11/2019 UPPER VENOUS STUDY  Indications: Swelling Comparison Study: no prior Performing Technologist: Abram Sander RVS  Examination Guidelines: A complete evaluation includes B-mode imaging, spectral Doppler, color Doppler, and power Doppler as needed of all accessible portions of each vessel. Bilateral testing is considered an integral part of a complete examination. Limited examinations for reoccurring indications may be performed as noted.  Right Findings: +----------+------------+---------+-----------+----------+-------+ RIGHT     CompressiblePhasicitySpontaneousPropertiesSummary +----------+------------+---------+-----------+----------+-------+ IJV           Full       Yes       Yes                      +----------+------------+---------+-----------+----------+-------+ Subclavian    Full  Yes       Yes                       +----------+------------+---------+-----------+----------+-------+ Axillary      Full       Yes       Yes                      +----------+------------+---------+-----------+----------+-------+ Brachial      Full       Yes       Yes                      +----------+------------+---------+-----------+----------+-------+ Radial        Full                                          +----------+------------+---------+-----------+----------+-------+ Ulnar         Full                                          +----------+------------+---------+-----------+----------+-------+ Cephalic      Full                                          +----------+------------+---------+-----------+----------+-------+ Basilic       Full                                          +----------+------------+---------+-----------+----------+-------+  Summary:  Right: No evidence of deep vein thrombosis in the upper extremity. No evidence of superficial vein thrombosis in the upper extremity.  *See table(s) above for measurements and observations.  Diagnosing physician: Curt Jews MD Electronically signed by Curt Jews MD on 09/11/2019 at 3:52:23 PM.    Final

## 2019-09-24 ENCOUNTER — Ambulatory Visit: Payer: BC Managed Care – PPO | Attending: Medical | Admitting: Occupational Therapy

## 2019-09-24 ENCOUNTER — Other Ambulatory Visit: Payer: Self-pay

## 2019-09-24 ENCOUNTER — Encounter: Payer: Self-pay | Admitting: Occupational Therapy

## 2019-09-24 DIAGNOSIS — I972 Postmastectomy lymphedema syndrome: Secondary | ICD-10-CM | POA: Diagnosis present

## 2019-09-24 DIAGNOSIS — I890 Lymphedema, not elsewhere classified: Secondary | ICD-10-CM | POA: Diagnosis present

## 2019-09-25 NOTE — Therapy (Addendum)
[facility] [address], [place], [address] Phone: [phone]   Fax:  [phone]  Occupational Therapy Evaluation  Patient Details  Name: Tina Potter MRN: [id number] Date of Birth: 02/21/1961 No data recorded  Encounter Date: 09/24/2019  OT End of Session - 09/25/19 1628    Visit Number  1    Number of Visits  24    OT Start Time  0800    OT Stop Time  0858    OT Time Calculation (min)  58 min    Activity Tolerance  Patient tolerated treatment well    Behavior During Therapy  [facility] for tasks assessed/performed       Past Medical History:  Diagnosis Date  . Breast cancer ([place])   . Breast cancer ([place]) 08/18/2011  . Lymphedema of arm   . Personal history of chemotherapy 2009  . Personal history of radiation therapy 2009    Past Surgical History:  Procedure Laterality Date  . BILATERAL OOPHORECTOMY  12/2008  . BREAST BIOPSY Right 03/05/2008   x2  . BREAST BIOPSY Left 04/07/2010  . BREAST BIOPSY Left 06/06/2014  . MASTECTOMY Right 2009  . MASTECTOMY, MODIFIED RADICAL W/RECONSTRUCTION  01/2008    There were no vitals filed for this visit.       LYMPHEDEMA/ONCOLOGY QUESTIONNAIRE - 09/25/19 1656      Type   Cancer Type  IIIb malignant neoplasm of the right breast,s/p mastectomy 2009 with reconstruction      Treatment   Active Chemotherapy Treatment  No    Past Chemotherapy Treatment  Yes    Active Radiation Treatment  No    Past Radiation Treatment  Yes    Current Hormone Treatment  No    Past Hormone Therapy  No      What other symptoms do you have   Are you Having Heaviness or Tightness  Yes    Are you having Pain  Yes    Is it Hard or Difficult finding clothes that fit  Yes    Do you have infections  Yes    Comments  cellulitis 2 weeks ago      Right Upper Extremity Lymphedema   At Axilla   36.8 cm    15 cm Proximal to Olecranon Process  35.3 cm    10 cm Proximal to Olecranon  Process  35.7 cm    Olecranon Process  30.8 cm    15 cm Proximal to Ulnar Styloid Process  29.3 cm    10 cm Proximal to Ulnar Styloid Process  26.3 cm    Just Proximal to Ulnar Styloid Process  18.9 cm    Across Hand at [employer]  20.8 cm    At [place] of 2nd Digit  6.6 cm    At [facility] of Thumb  6.9 cm      Left Upper Extremity Lymphedema   At Axilla   35.6 cm    15 cm Proximal to Olecranon Process  34.2 cm    10 cm Proximal to Olecranon Process  32.5 cm    Olecranon Process  27.7 cm    15 cm Proximal to Ulnar Styloid Process  25.9 cm    10 cm Proximal to Ulnar Styloid Process  22.8 cm    Just Proximal to Ulnar Styloid Process  16.8 cm    Across Hand at [employer]  19.4 cm    At [place] of 2nd  Digit  6.5 cm    At [facility] of Thumb  6.8 cm       Patient has a daytime compression sleeve however time for replacement, has a glove but does not wear it.  She has a reid sleeve for nighttime but has not had it recalibrated in a while.  She has never been instructed on MLD techniques and has no pump.  She does report some increased trunk edema worsening over time.   Recommend patient make adjustments with Reid sleeve and wear at night for the next week, wear daytime sleeve and we will consider compression bandaging to further impact edema to reduce prior to being fitted for new sleeve.  Husband will be able to assist with bandaging after instruction.     FOTO SCORE 69          OT Education - 09/25/19 1628    Education Details  role of OT, goals, plan of care    Person(s) Educated  Patient    Methods  Explanation    Comprehension  Verbalized understanding          OT Long Term Goals - 09/25/19 1647      OT LONG TERM GOAL #1   Title  Patient will demonstrate understanding of home program including exercises, skin care, MLD and bandaging for management of lymphedema symptoms.    Baseline  patient needs instruction on MLD    Time  8    Period  Weeks    Status  New     Target Date  11/12/19      OT LONG TERM GOAL #2   Title  Patient and husband will demonstrate understanding of compression bandaging for home program.    Baseline  unable to recall compression bandaging from past episode of care    Time  4    Period  Weeks    Status  New    Target Date  10/22/19      OT LONG TERM GOAL #3   Title  Patient will demonstrate decrease in circumferential measurements by 2 cm from wrist, forearm, elbow and upper arm.    Baseline  significant increase in edema, see flowsheet    Time  8    Period  Weeks    Status  New    Target Date  11/12/19      OT LONG TERM GOAL #4   Title  Patient to be independent in use of day time and night time garments for management of lymphedema symptoms.    Baseline  pt reports inconsistent use of compression in the past.    Time  8    Period  Weeks    Status  New    Target Date  11/12/19            Plan - 09/25/19 1637    Clinical Impression Statement  Patient is a 59 yo female diagnosed in 2009 with stage IIIb malignant neoplasm of the right breast, s/p mastectomy with breast reconstruction. She recently had an increase in edema in her right arm, negative for DVT and reports cellulitis.  She was attempting to manage her symptoms of lymphedema with use of a day compression sleeve most days and use of Reid sleeve at night occasionally.  She presents with increased edema in right UE compared to left, decreased ROM in right shoulder and mild pain.  She would benefit from a comprehensive lymphedema treatment approach with use of compression bandaging, manual lymphatic drainage, exercise  and skin care to manage her symptoms of lymphedema and decrease her risk for recurent infections.    OT Occupational Profile and History  Detailed Assessment- Review of Records and additional review of physical, cognitive, psychosocial history related to current functional performance    Occupational performance deficits (Please refer to evaluation  for details):  ADL's;IADL's;Work;Leisure    Body Structure / Function / Physical Skills  ADL;Strength;Pain;Edema;UE functional use;IADL;ROM    Psychosocial Skills  Environmental  Adaptations;Routines and Behaviors;Habits;Coping Strategies    Rehab Potential  Excellent    Clinical Decision Making  Limited treatment options, no task modification necessary    Comorbidities Affecting Occupational Performance:  May have comorbidities impacting occupational performance    Modification or Assistance to Complete Evaluation   No modification of tasks or assist necessary to complete eval    OT Frequency  3x / week    OT Duration  8 weeks    OT Treatment/Interventions  Self-care/ADL training;DME and/or AE instruction;Manual lymph drainage;Compression bandaging;Therapeutic exercise;Coping strategies training;Manual Therapy;Patient/family education    Consulted and Agree with Plan of Care  Patient       Patient will benefit from skilled therapeutic intervention in order to improve the following deficits and impairments:   Body Structure / Function / Physical Skills: ADL, Strength, Pain, Edema, UE functional use, IADL, ROM   Psychosocial Skills: Environmental  Adaptations, Routines and Behaviors, Habits, Coping Strategies   Visit Diagnosis: Lymphedema, not elsewhere classified    Problem List Patient Active Problem List   Diagnosis Date Noted  . Breast cancer of lower-outer quadrant of right female breast ([place]) 08/18/2011    Lolita Faulds 09/25/2019, 4:59 PM  [facility] [address] [place], [address] Phone: [phone]   Fax:  [phone]  Name: Tina Potter MRN: [id number] Date of Birth: 07-03-1960

## 2019-10-07 ENCOUNTER — Ambulatory Visit: Payer: BC Managed Care – PPO | Attending: Medical | Admitting: Occupational Therapy

## 2019-10-07 ENCOUNTER — Other Ambulatory Visit: Payer: Self-pay

## 2019-10-07 DIAGNOSIS — I972 Postmastectomy lymphedema syndrome: Secondary | ICD-10-CM | POA: Insufficient documentation

## 2019-10-07 DIAGNOSIS — I890 Lymphedema, not elsewhere classified: Secondary | ICD-10-CM | POA: Insufficient documentation

## 2019-10-07 NOTE — Therapy (Signed)
[facility] [address], [place], [address] Phone: [phone]   Fax:  [phone]  Occupational Therapy Treatment  Patient Details  Name: Tina Potter MRN: [id number] Date of Birth: [birth year] No data recorded  Encounter Date: 10/07/2019  OT End of Session - 10/07/19 1053    Visit Number  2    Number of Visits  24    Date for OT Re-Evaluation  11/06/19    OT Start Time  1000    OT Stop Time  1041    OT Time Calculation (min)  41 min    Activity Tolerance  Patient tolerated treatment well    Behavior During Therapy  [facility] for tasks assessed/performed       Past Medical History:  Diagnosis Date  . Breast cancer ([place])   . Breast cancer ([place]) 08/18/2011  . Lymphedema of arm   . Personal history of chemotherapy 2009  . Personal history of radiation therapy 2009    Past Surgical History:  Procedure Laterality Date  . BILATERAL OOPHORECTOMY  12/2008  . BREAST BIOPSY Right 03/05/2008   x2  . BREAST BIOPSY Left 04/07/2010  . BREAST BIOPSY Left 06/06/2014  . MASTECTOMY Right 2009  . MASTECTOMY, MODIFIED RADICAL W/RECONSTRUCTION  01/2008    There were no vitals filed for this visit.  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 Mastectomy with right breast with reconstruction.  She was diagnosed with lymphedema in the past and has had treatment but now has Recent cellulitis, negative for DVT    Patient Stated Goals  Patient reports she would like to manage her edema in her arm.     Currently in Pain?  Yes    Pain Location  --   under R arm   Pain Orientation  Right    Pain Descriptors / Indicators  Aching;Sharp;Shooting    Pain Type  Chronic pain    Pain Onset  More than a month ago    Pain Frequency  Intermittent          LYMPHEDEMA/ONCOLOGY QUESTIONNAIRE - 10/07/19 1011      Right Upper Extremity Lymphedema   15 cm Proximal to Olecranon Process  35.5 cm    10 cm Proximal to Olecranon Process  35.8 cm    Olecranon Process  30 cm    15 cm Proximal to Ulnar Styloid Process  29.3 cm    10 cm Proximal to Ulnar Styloid Process  25.5 cm    Just Proximal to Ulnar Styloid Process  20.5 cm    Across Hand at [employer]  20 cm    At [place] of 2nd Digit  7 cm    At [facility] of Thumb  6.4 cm      Pt present this date with continues increase circumference in R UE - with fibrotic changes in forearm/hand  more than upper arm. Since eval husband did calibrate her Reidsleeve - but appear he did  same pressure thru out the whole arm  Pt ed on doing 30-48mmHG at hand and then decrease gradually to about 12-10 mmhg at upper arm - if not able to tolerate start at 25 at the hand  Where Reidsleeve most all the time -and if on computer use her Lelan Pons but issued isotoner glove to use  Education done again about lymphedema , flareup and Plan, homeprogram  Pt to follow up in week again for assessing progress                  OT Education - 10/07/19 1049    Education Details  use of her compression garments for week    Person(s) Educated  Patient    Methods  Explanation    Comprehension  Verbalized understanding          OT Long Term Goals - 09/25/19 1647      OT LONG TERM GOAL #1   Title  Patient will demonstrate understanding of home program including exercises, skin care, MLD and bandaging for management of lymphedema symptoms.    Baseline  patient needs instruction on MLD    Time  8    Period  Weeks    Status  New    Target Date  11/12/19      OT  LONG TERM GOAL #2   Title  Patient and husband will demonstrate understanding of compression bandaging for home program.    Baseline  unable to recall compression bandaging from past episode of care    Time  4    Period  Weeks    Status  New    Target Date  10/22/19      OT LONG TERM GOAL #3   Title  Patient will demonstrate decrease in circumferential measurements by 2 cm from wrist, forearm, elbow and upper arm.    Baseline  significant increase in edema, see flowsheet    Time  8    Period  Weeks    Status  New    Target Date  11/12/19      OT LONG TERM GOAL #4   Title  Patient to be independent in use of day time and night time garments for management of lymphedema symptoms.    Baseline  pt reports inconsistent use of compression in the past.    Time  8    Period  Weeks    Status  New    Target Date  11/12/19            Plan - 10/07/19 1054    Clinical Impression Statement  Pt had lymphedema since 2011 using different compression sleeves - Reidsleeve at night time , replace the Jobst Elvarex soft sleeve with over the counter thinner one and also wearing at times during day Tribute with compression sleeve - but pt had Cellulites episode about 3 wks ago - pt present this date with increase circumference in R dominant UE  - compare to L she is increase 3.7cm at wrist, 3.4 at forearm , elbow 2.3 and upper arm 3.3 cm - pt do report increase lymphedema under her arm with some shooting pain at times thru her breast - pt and husband ed on using her Reidsleeve and Tribute to decrease her circumference - how to calibrate her Reidsleeve - and do that for week and return for reassesment - pt had 3 wks ago cellulitis and this date with fibrosis more in R forearm - pt can benefit from compression pump  to clear thoracic lymphedema  and R UE - to prevent future cellulits flareups and increase compliance    OT Occupational Profile and History  Detailed Assessment- Review of Records and additional  review of physical, cognitive, psychosocial history related to current functional performance    Occupational performance deficits (Please refer to evaluation for details):  ADL's;IADL's;Work;Leisure    Body Structure / Function / Physical Skills  ADL;Strength;Pain;Edema;UE functional use;IADL;ROM    Psychosocial Skills  Environmental  Adaptations;Routines and Behaviors;Habits;Coping Strategies    Rehab Potential  Excellent    Clinical Decision Making  Limited treatment options, no task modification necessary    Comorbidities Affecting Occupational Performance:  May have comorbidities impacting occupational performance    Modification or Assistance to Complete Evaluation   No modification of tasks or assist necessary to complete eval    OT Frequency  1x / week    OT Duration  8 weeks    OT Treatment/Interventions  Self-care/ADL training;DME and/or AE instruction;Manual lymph drainage;Compression bandaging;Therapeutic exercise;Coping strategies training;Manual Therapy;Patient/family education    Plan  assess circumference progress with use of compression garments    Consulted and Agree with Plan of Care  Patient       Patient will benefit from skilled therapeutic intervention in order to improve the following deficits and impairments:   Body Structure / Function / Physical Skills: ADL, Strength, Pain, Edema, UE functional use, IADL, ROM   Psychosocial Skills: Environmental  Adaptations, Routines and Behaviors, Habits, Coping Strategies   Visit Diagnosis: Lymphedema, not elsewhere classified  Postmastectomy lymphedema syndrome    Problem List Patient Active Problem List   Diagnosis Date Noted  . Breast cancer of lower-outer quadrant of right female breast ([place]) 08/18/2011    Rosalyn Gess OTR/L,CLT 10/07/2019, 11:09 AM  [facility] [address], [place], [address] Phone: [phone]   Fax:   [phone]  Name: Audriella HAILEI RASMUS MRN: [id number] Date of Birth: [birth year]

## 2019-10-07 NOTE — Patient Instructions (Signed)
Pt to wear her Reidsleeve every night and as much as she can during day - for week  If on computer use her Tribute sleeve  Husband to recalibrate her Reidsleeve - ed on pressure And isotoner glove during day if using Lelan Pons

## 2019-10-14 ENCOUNTER — Ambulatory Visit: Payer: BC Managed Care – PPO | Admitting: Occupational Therapy

## 2019-10-14 ENCOUNTER — Other Ambulatory Visit: Payer: Self-pay

## 2019-10-14 DIAGNOSIS — I890 Lymphedema, not elsewhere classified: Secondary | ICD-10-CM

## 2019-10-14 DIAGNOSIS — I972 Postmastectomy lymphedema syndrome: Secondary | ICD-10-CM

## 2019-10-14 NOTE — Patient Instructions (Signed)
Same but do short stretch from hand to upper arm - instead of power sleeve over Tina Potter

## 2019-10-14 NOTE — Therapy (Signed)
[facility] [address] [place], [address] Phone: [phone]   Fax:  [phone]  Occupational Therapy Treatment  Patient Details  Name: Tina Potter MRN: [id number] Date of Birth: May 08, 1961 No data recorded  Encounter Date: 10/14/2019  OT End of Session - 10/14/19 1126    Visit Number  3    Number of Visits  24    Date for OT Re-Evaluation  11/06/19    OT Start Time  1015    OT Stop Time  1048    OT Time Calculation (min)  33 min    Activity Tolerance  Patient tolerated treatment well    Behavior During Therapy  [facility] for tasks assessed/performed       Past Medical History:  Diagnosis Date  . Breast cancer ([place])   . Breast cancer ([place]) 08/18/2011  . Lymphedema of arm   . Personal history of chemotherapy 2009  . Personal history of radiation therapy 2009    Past Surgical History:  Procedure Laterality Date  . BILATERAL OOPHORECTOMY  12/2008  . BREAST BIOPSY Right 03/05/2008   x2  . BREAST BIOPSY Left 04/07/2010  . BREAST BIOPSY Left 06/06/2014  . MASTECTOMY Right 2009  . MASTECTOMY, MODIFIED RADICAL W/RECONSTRUCTION  01/2008    There were no vitals filed for this visit.  Subjective Assessment - 10/14/19 1124    Subjective   I wore th Reidsleeve every night and then the Tribure during day when on the computer - but it feels like the forearm did not decrease well - my husband did calibrate the Reidsleeve    Pertinent History  Onset of cancer in 2009. 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 Mastectomy with right breast with reconstruction.  She was diagnosed with lymphedema in the past and has had treatment but now has Recent cellulitis, negative for DVT    Patient Stated Goals  Patient reports she would like to manage her edema in her arm.    Currently in Pain?  No/denies          LYMPHEDEMA/ONCOLOGY QUESTIONNAIRE - 10/14/19 1020      Right Upper Extremity Lymphedema   15 cm  Proximal to Olecranon Process  35 cm    10 cm Proximal to Olecranon Process  34.8 cm    Olecranon Process  30 cm    15 cm Proximal to Ulnar Styloid Process  29 cm    10 cm Proximal to Ulnar Styloid Process  25 cm    Just Proximal to Ulnar Styloid Process  19 cm    Across Hand at [employer]  19.8 cm    At [facility] of 2nd Digit  6 cm    At [facility] of Thumb  6.5 cm        Pt's wrist and upper arm decrease greatly compare to last week -using her Reidsleeve Classic at night time and during day Tribute with power sleeve  Her husband did calibrate her night time Reidsleeve - 30 mmHg at hand to about 18 at upper arm  Pt forearm still increase -assess her Lelan Pons too -and power sleeve did not provided any compression - done Short stretch bandage 8cm from wrist and upper arm - but redone and pt ed on using it instead over power sleeve during day - but do hand too and go circulare to upper arm  Will reassess in week - goal for forearm and elbow to decrease  Was  still increase this date  2.3 cm at wrist, forearm 3.1 cm and elbow 2.2 cm                  OT Education - 10/14/19 1126    Education Details  progress and changes using compression garments    Person(s) Educated  Patient    Methods  Explanation    Comprehension  Verbalized understanding          OT Long Term Goals - 09/25/19 1647      OT LONG TERM GOAL #1   Title  Patient will demonstrate understanding of home program including exercises, skin care, MLD and bandaging for management of lymphedema symptoms.    Baseline  patient needs instruction on MLD    Time  8    Period  Weeks    Status  New    Target Date  11/12/19      OT LONG TERM GOAL #2   Title  Patient and husband will demonstrate understanding of compression bandaging for home program.    Baseline  unable to recall compression bandaging from past episode of care    Time  4    Period  Weeks    Status  New    Target Date  10/22/19      OT LONG TERM GOAL  #3   Title  Patient will demonstrate decrease in circumferential measurements by 2 cm from wrist, forearm, elbow and upper arm.    Baseline  significant increase in edema, see flowsheet    Time  8    Period  Weeks    Status  New    Target Date  11/12/19      OT LONG TERM GOAL #4   Title  Patient to be independent in use of day time and night time garments for management of lymphedema symptoms.    Baseline  pt reports inconsistent use of compression in the past.    Time  8    Period  Weeks    Status  New    Target Date  11/12/19            Plan - 10/14/19 1127    Clinical Impression Statement  Pt wore the last week her Reidsleeve Classic at night that her husband calibrated - and Tribure with power sleeve during day - and on computer working - upon assessment her wrist and uppper arm decrease greatly - but forearm and elbow same - pt's power sleeve over Lelan Pons do not provide any compression - pt ed on using short stretch bandage from hand to upper arm during day instead over Lelan Pons -and will reassess in week    OT Occupational Profile and History  Detailed Assessment- Review of Records and additional review of physical, cognitive, psychosocial history related to current functional performance    Occupational performance deficits (Please refer to evaluation for details):  ADL's;IADL's;Work;Leisure    Body Structure / Function / Physical Skills  ADL;Strength;Pain;Edema;UE functional use;IADL;ROM    Psychosocial Skills  Environmental  Adaptations;Routines and Behaviors;Habits;Coping Strategies    Rehab Potential  Excellent    Clinical Decision Making  Limited treatment options, no task modification necessary    Comorbidities Affecting Occupational Performance:  May have comorbidities impacting occupational performance    Modification or Assistance to Complete Evaluation   No modification of tasks or assist necessary to complete eval    OT Frequency  1x / week    OT Duration  8 weeks  OT Treatment/Interventions  Self-care/ADL training;DME and/or AE instruction;Manual lymph drainage;Compression bandaging;Therapeutic exercise;Coping strategies training;Manual Therapy;Patient/family education    Plan  assess circumference progress with use of compression garments    Consulted and Agree with Plan of Care  Patient       Patient will benefit from skilled therapeutic intervention in order to improve the following deficits and impairments:   Body Structure / Function / Physical Skills: ADL, Strength, Pain, Edema, UE functional use, IADL, ROM   Psychosocial Skills: Environmental  Adaptations, Routines and Behaviors, Habits, Coping Strategies   Visit Diagnosis: Lymphedema, not elsewhere classified  Postmastectomy lymphedema syndrome    Problem List Patient Active Problem List   Diagnosis Date Noted  . Breast cancer of lower-outer quadrant of right female breast ([place]) 08/18/2011    Rosalyn Gess OTR/l,CLT 10/14/2019, 11:29 AM  [facility] [address], [place], [address] Phone: [phone]   Fax:  [phone]  Name: Tina Potter MRN: [id number] Date of Birth: 11/19/1960

## 2019-10-21 ENCOUNTER — Ambulatory Visit: Payer: BC Managed Care – PPO | Admitting: Occupational Therapy

## 2019-10-21 ENCOUNTER — Other Ambulatory Visit: Payer: Self-pay

## 2019-10-21 DIAGNOSIS — I972 Postmastectomy lymphedema syndrome: Secondary | ICD-10-CM

## 2019-10-21 DIAGNOSIS — I890 Lymphedema, not elsewhere classified: Secondary | ICD-10-CM

## 2019-10-21 NOTE — Therapy (Signed)
[facility] [address] [place], [address] Phone: [phone]   Fax:  [phone]  Occupational Therapy Treatment  Patient Details  Name: Tina Potter MRN: [id number] Date of Birth: 1961-05-24 No data recorded  Encounter Date: 10/21/2019  OT End of Session - 10/21/19 0957    Visit Number  4    Number of Visits  24    Date for OT Re-Evaluation  11/06/19    OT Start Time  0930    OT Stop Time  0953    OT Time Calculation (min)  23 min    Activity Tolerance  Patient tolerated treatment well    Behavior During Therapy  [facility] for tasks assessed/performed       Past Medical History:  Diagnosis Date  . Breast cancer ([place])   . Breast cancer ([place]) 08/18/2011  . Lymphedema of arm   . Personal history of chemotherapy 2009  . Personal history of radiation therapy 2009    Past Surgical History:  Procedure Laterality Date  . BILATERAL OOPHORECTOMY  12/2008  . BREAST BIOPSY Right 03/05/2008   x2  . BREAST BIOPSY Left 04/07/2010  . BREAST BIOPSY Left 06/06/2014  . MASTECTOMY Right 2009  . MASTECTOMY, MODIFIED RADICAL W/RECONSTRUCTION  01/2008    There were no vitals filed for this visit.  Subjective Assessment - 10/21/19 0955    Subjective   I done the Reidsleeve night time and then during day my hubby wrapped in the am when he left - I can see my wrist bone now but forearm still little full    Pertinent History  Onset of cancer in 2009. 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 Mastectomy with right breast with reconstruction.  She was diagnosed with lymphedema in the past and has had treatment but now has Recent cellulitis, negative for DVT    Patient Stated Goals  Patient reports she would like to manage her edema in her arm.    Currently in Pain?  No/denies          LYMPHEDEMA/ONCOLOGY QUESTIONNAIRE - 10/21/19 0935      Right Upper Extremity Lymphedema   15 cm Proximal to Olecranon Process   34.8 cm    10 cm Proximal to Olecranon Process  35 cm    Olecranon Process  30 cm    15 cm Proximal to Ulnar Styloid Process  28.5 cm    10 cm Proximal to Ulnar Styloid Process  25 cm    Just Proximal to Ulnar Styloid Process  19 cm    Across Hand at [employer]  19.5 cm    At [place] of 2nd Digit  6 cm    At [facility] of Thumb  6.4 cm        Pt's proximal forearm and upper arm decrease some with changes made last week to daytime compression -using her Reidsleeve Classic at night time and during day Tribute with short stretch bandage 8cm from wrist and upper arm -  using it instead over power sleeve during day - but do hand too and go circulare to upper arm  Her husband did calibrate her night time Reidsleeve - 30 mmHg at hand to about 18 at upper arm  Change short stretch bandaging to figure 8's at wrist to elbow -and then power sleeve over it during da  Was still increase this date  2.2 cm at wrist, forearm 2.5 cm and elbow and upper  arm increase 2.3 to 2.5 cm    Will reassess in week - goal for forearm and  Thru upper arm to decrease  Will fax into to Rep to check insurance for pump to decrease fibrosis in forearm -and in combination with compression garments to prevent future cellulitis episodes                 OT Education - 10/21/19 0957    Education Details  progress and changes using compression garments/bandages    Person(s) Educated  Patient    Methods  Explanation    Comprehension  Verbalized understanding          OT Long Term Goals - 09/25/19 1647      OT LONG TERM GOAL #1   Title  Patient will demonstrate understanding of home program including exercises, skin care, MLD and bandaging for management of lymphedema symptoms.    Baseline  patient needs instruction on MLD    Time  8    Period  Weeks    Status  New    Target Date  11/12/19      OT LONG TERM GOAL #2   Title  Patient and husband will demonstrate understanding of compression bandaging for  home program.    Baseline  unable to recall compression bandaging from past episode of care    Time  4    Period  Weeks    Status  New    Target Date  10/22/19      OT LONG TERM GOAL #3   Title  Patient will demonstrate decrease in circumferential measurements by 2 cm from wrist, forearm, elbow and upper arm.    Baseline  significant increase in edema, see flowsheet    Time  8    Period  Weeks    Status  New    Target Date  11/12/19      OT LONG TERM GOAL #4   Title  Patient to be independent in use of day time and night time garments for management of lymphedema symptoms.    Baseline  pt reports inconsistent use of compression in the past.    Time  8    Period  Weeks    Status  New    Target Date  11/12/19            Plan - 10/21/19 1008    Clinical Impression Statement  Pt cont to be increase 2.2 to 2.5 cm at forearm-  did decrease 0.5 at proximal forearm , elbow and upper arm still increase 2.3 and 2.5 cm - pt to change the short stretch bandaging over Tribute to figure 8's to proximal to forearm and put powersleeve over that for some compression on upper arm -and cont Reidsleeve for night time - pt will be in need for pump to decrease fibrosis in forearm and wrist -  to use in combination with night sleeve and day sleeve    OT Occupational Profile and History  Detailed Assessment- Review of Records and additional review of physical, cognitive, psychosocial history related to current functional performance    Occupational performance deficits (Please refer to evaluation for details):  ADL's;IADL's;Work;Leisure    Body Structure / Function / Physical Skills  ADL;Strength;Pain;Edema;UE functional use;IADL;ROM    Psychosocial Skills  Environmental  Adaptations;Routines and Behaviors;Habits;Coping Strategies    Rehab Potential  Excellent    Clinical Decision Making  Limited treatment options, no task modification necessary    Comorbidities Affecting Occupational Performance:  May  have comorbidities impacting occupational performance    Modification or Assistance to Complete Evaluation   No modification of tasks or assist necessary to complete eval    OT Frequency  1x / week    OT Duration  6 weeks    OT Treatment/Interventions  Self-care/ADL training;DME and/or AE instruction;Manual lymph drainage;Compression bandaging;Therapeutic exercise;Coping strategies training;Manual Therapy;Patient/family education    Plan  assess circumference progress with use of compression garments    Consulted and Agree with Plan of Care  Patient       Patient will benefit from skilled therapeutic intervention in order to improve the following deficits and impairments:   Body Structure / Function / Physical Skills: ADL, Strength, Pain, Edema, UE functional use, IADL, ROM   Psychosocial Skills: Environmental  Adaptations, Routines and Behaviors, Habits, Coping Strategies   Visit Diagnosis: Lymphedema, not elsewhere classified  Postmastectomy lymphedema syndrome    Problem List Patient Active Problem List   Diagnosis Date Noted  . Breast cancer of lower-outer quadrant of right female breast ([place]) 08/18/2011    Rosalyn Gess OTR/l,CLT 10/21/2019, 10:13 AM  [facility] [address] [place], [address] Phone: [phone]   Fax:  [phone]  Name: Mikeisha LALENA STOLTZFOOS MRN: [id number] Date of Birth: 1961-03-25

## 2019-10-28 ENCOUNTER — Other Ambulatory Visit: Payer: Self-pay

## 2019-10-28 ENCOUNTER — Ambulatory Visit: Payer: BC Managed Care – PPO | Attending: Medical | Admitting: Occupational Therapy

## 2019-10-28 DIAGNOSIS — I890 Lymphedema, not elsewhere classified: Secondary | ICD-10-CM | POA: Diagnosis not present

## 2019-10-28 DIAGNOSIS — I972 Postmastectomy lymphedema syndrome: Secondary | ICD-10-CM | POA: Insufficient documentation

## 2019-10-28 NOTE — Therapy (Signed)
[facility] [address] [place], [address] Phone: [phone]   Fax:  [phone]  Occupational Therapy Treatment  Patient Details  Name: Tina Potter MRN: [id number] Date of Birth: 11-Oct-1960 No data recorded  Encounter Date: 10/28/2019  OT End of Session - 10/28/19 1224    Visit Number  5    Number of Visits  24    Date for OT Re-Evaluation  11/06/19    OT Start Time  1033    OT Stop Time  1100    OT Time Calculation (min)  27 min    Activity Tolerance  Patient tolerated treatment well    Behavior During Therapy  [facility] for tasks assessed/performed       Past Medical History:  Diagnosis Date  . Breast cancer ([place])   . Breast cancer ([place]) 08/18/2011  . Lymphedema of arm   . Personal history of chemotherapy 2009  . Personal history of radiation therapy 2009    Past Surgical History:  Procedure Laterality Date  . BILATERAL OOPHORECTOMY  12/2008  . BREAST BIOPSY Right 03/05/2008   x2  . BREAST BIOPSY Left 04/07/2010  . BREAST BIOPSY Left 06/06/2014  . MASTECTOMY Right 2009  . MASTECTOMY, MODIFIED RADICAL W/RECONSTRUCTION  01/2008    There were no vitals filed for this visit.  Subjective Assessment - 10/28/19 1223    Subjective   I done the Reidsleeve for night time again and then my hubby wrapped like you told me - figure 8's from wrist to elbow over the black on during day -I think it come down some more - they did call me about the pump - that insurance approved it    Pertinent History  Onset of cancer in 2009. 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 Mastectomy with right breast with reconstruction.  She was diagnosed with lymphedema in the past and has had treatment but now has Recent cellulitis, negative for DVT    Patient Stated Goals  Patient reports she would like to manage her edema in her arm.    Currently in Pain?  No/denies          LYMPHEDEMA/ONCOLOGY QUESTIONNAIRE -  10/28/19 1036      Right Upper Extremity Lymphedema   15 cm Proximal to Olecranon Process  35 cm    10 cm Proximal to Olecranon Process  35 cm    Olecranon Process  30 cm    15 cm Proximal to Ulnar Styloid Process  28 cm    10 cm Proximal to Ulnar Styloid Process  24 cm    Just Proximal to Ulnar Styloid Process  18.7 cm    Across Hand at [employer]  19.5 cm    At [place] of 2nd Digit  6.2 cm    At [facility] of Thumb  6.4 cm         Pt's forearm decrease some with changes made last week to daytime compression doing figure 8's with short stretch 8cm width -using her Reidsleeve Classic at night time and during day Tribute with short stretch bandage 8cm from wrist and upper arm -   Her husband did calibrate her night time Reidsleeve - 30 mmHg at hand to about 18 at upper arm  Change short stretch bandaging this date to 10 cm width and cont  to figure 8's at wrist to elbow and then circular to mid upper arm  -and then power sleeve over  it during dau  Pt to get measure later this week for daytime Jobst Elverax soft sleeve and glove - and if fitted with pump the next week - start using it about 45 min to 1 hour - am and pm  IN combination with compression garments  Will reassess in 10 days - goal for forearm and  Thru upper arm to decrease  Pt to get prescription from her MD and fax to [employer]               OT Education - 10/28/19 1224    Education Details  progress and changes using compression garments/bandages    Person(s) Educated  Patient    Methods  Explanation    Comprehension  Verbalized understanding          OT Long Term Goals - 09/25/19 1647      OT LONG TERM GOAL #1   Title  Patient will demonstrate understanding of home program including exercises, skin care, MLD and bandaging for management of lymphedema symptoms.    Baseline  patient needs instruction on MLD    Time  8    Period  Weeks    Status  New    Target Date  11/12/19      OT LONG TERM GOAL  #2   Title  Patient and husband will demonstrate understanding of compression bandaging for home program.    Baseline  unable to recall compression bandaging from past episode of care    Time  4    Period  Weeks    Status  New    Target Date  10/22/19      OT LONG TERM GOAL #3   Title  Patient will demonstrate decrease in circumferential measurements by 2 cm from wrist, forearm, elbow and upper arm.    Baseline  significant increase in edema, see flowsheet    Time  8    Period  Weeks    Status  New    Target Date  11/12/19      OT LONG TERM GOAL #4   Title  Patient to be independent in use of day time and night time garments for management of lymphedema symptoms.    Baseline  pt reports inconsistent use of compression in the past.    Time  8    Period  Weeks    Status  New    Target Date  11/12/19            Plan - 10/28/19 1225    Clinical Impression Statement  Pt  cont to decongest but still increase 1.2 to 2.1 cm in forearm,  elbow and upper arm still increase 2.3 and 2.5 cm - pt did change to doing short stretch bandaging over Tribute to figure 8's last time - but this date change to 10 cm width - and to go past elbow  to mid upper arm and  put powersleeve over that for some compression on upper arm -and cont Reidsleeve for night time - pt can start using the pump 2 x day if she gets fitted to decrease fibrosis in forearm and wrist -  to use in combination with night sleeve and day sleeve    OT Occupational Profile and History  Detailed Assessment- Review of Records and additional review of physical, cognitive, psychosocial history related to current functional performance    Occupational performance deficits (Please refer to evaluation for details):  ADL's;IADL's;Work;Leisure    Body Structure / Function /  Physical Skills  ADL;Strength;Pain;Edema;UE functional use;IADL;ROM    Psychosocial Skills  Environmental  Adaptations;Routines and Behaviors;Habits;Coping Strategies     Rehab Potential  Excellent    Clinical Decision Making  Limited treatment options, no task modification necessary    Comorbidities Affecting Occupational Performance:  May have comorbidities impacting occupational performance    Modification or Assistance to Complete Evaluation   No modification of tasks or assist necessary to complete eval    OT Frequency  1x / week    OT Treatment/Interventions  Self-care/ADL training;DME and/or AE instruction;Manual lymph drainage;Compression bandaging;Therapeutic exercise;Coping strategies training;Manual Therapy;Patient/family education    Plan  assess circumference progress with use of compression garments    Consulted and Agree with Plan of Care  Patient       Patient will benefit from skilled therapeutic intervention in order to improve the following deficits and impairments:   Body Structure / Function / Physical Skills: ADL, Strength, Pain, Edema, UE functional use, IADL, ROM   Psychosocial Skills: Environmental  Adaptations, Routines and Behaviors, Habits, Coping Strategies   Visit Diagnosis: Lymphedema, not elsewhere classified  Postmastectomy lymphedema syndrome    Problem List Patient Active Problem List   Diagnosis Date Noted  . Breast cancer of lower-outer quadrant of right female breast ([place]) 08/18/2011    Rosalyn Gess OTR/l,CLT 10/28/2019, 12:30 PM  [facility] [address], [place], [address] Phone: [phone]   Fax:  [phone]  Name: Gabryella BAELEY ORLIKOWSKI MRN: [id number] Date of Birth: Feb 04, 1961

## 2019-10-28 NOTE — Patient Instructions (Signed)
See note

## 2019-11-07 ENCOUNTER — Ambulatory Visit: Payer: BC Managed Care – PPO | Admitting: Occupational Therapy

## 2019-11-07 ENCOUNTER — Other Ambulatory Visit: Payer: Self-pay

## 2019-11-07 DIAGNOSIS — I890 Lymphedema, not elsewhere classified: Secondary | ICD-10-CM

## 2019-11-07 DIAGNOSIS — I972 Postmastectomy lymphedema syndrome: Secondary | ICD-10-CM

## 2019-11-07 NOTE — Therapy (Signed)
[facility] [address], [place], [address] Phone: [phone]   Fax:  [phone]  Occupational Therapy Treatment  Patient Details  Name: Tina Potter MRN: [id number] Date of Birth: 20-Jan-1961 No data recorded  Encounter Date: 11/07/2019  OT End of Session - 11/07/19 1042    Visit Number  6    Number of Visits  10    Date for OT Re-Evaluation  01/01/20    OT Start Time  1000    OT Stop Time  1032    OT Time Calculation (min)  32 min    Activity Tolerance  Patient tolerated treatment well    Behavior During Therapy  [facility] for tasks assessed/performed       Past Medical History:  Diagnosis Date  . Breast cancer ([place])   . Breast cancer ([place]) 08/18/2011  . Lymphedema of arm   . Personal history of chemotherapy 2009  . Personal history of radiation therapy 2009    Past Surgical History:  Procedure Laterality Date  . BILATERAL OOPHORECTOMY  12/2008  . BREAST BIOPSY Right 03/05/2008   x2  . BREAST BIOPSY Left 04/07/2010  . BREAST BIOPSY Left 06/06/2014  . MASTECTOMY Right 2009  . MASTECTOMY, MODIFIED RADICAL W/RECONSTRUCTION  01/2008    There were no vitals filed for this visit.  Subjective Assessment - 11/07/19 1040    Subjective   I did get my pump and used it since last Friday every day - but when I work - I only do it once day - did get measured for my compression sleeve and glove- but I think my arm come down    Pertinent History  Onset of cancer in 2009. 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 Mastectomy with right breast with reconstruction.  She was diagnosed with lymphedema in the past and has had treatment but now has Recent cellulitis, negative for DVT    Patient Stated Goals  Patient reports she would like to manage her edema in her arm.    Currently in Pain?  No/denies          LYMPHEDEMA/ONCOLOGY QUESTIONNAIRE - 11/07/19 1008      Right Upper Extremity Lymphedema   15  cm Proximal to Olecranon Process  35 cm    10 cm Proximal to Olecranon Process  35 cm    Olecranon Process  29 cm    15 cm Proximal to Ulnar Styloid Process  27.3 cm    10 cm Proximal to Ulnar Styloid Process  24 cm    Just Proximal to Ulnar Styloid Process  18.4 cm    Across Hand at [employer]  19.5 cm    At [place] of 2nd Digit  6 cm    At [facility] of Thumb  6.4 cm        Pt'sR wrist,forearm and elbowdecreasewith use of pump at least 1 x day since last week ,using her Reidsleeve Classic at night time and during day Tribute withshort stretch bandage 8cm from wrist and upper arm -  Her husband did calibrate her night time Reidsleeve - 30 mmHg at hand to about 18 at upper arm2nd visit -pt's husband to calibrate it again because of her decongesting the last few wks Pt did get measured last week for daytime Jobst Elverax soft sleeve and glove - and got her pump last Thursday - she is using it at least once day for hour - IN combination  with compression garments  Feels like her arm is softer and smaller -  Measured -and see flow sheet- did decongest - tribute could be adjusted because of arm getting smaller   Will reassess fit of new daytime compression sleeve and glove in about 14 days               OT Education - 11/07/19 1041    Education Details  progress and changes using compression garments/bandages    Person(s) Educated  Patient    Methods  Explanation    Comprehension  Verbalized understanding          OT Long Term Goals - 11/07/19 [place] #1   Title  Patient will demonstrate understanding of home program including exercises, skin care, MLD and bandaging for management of lymphedema symptoms.    Status  Achieved      OT LONG TERM GOAL #2   Title  Patient and husband will demonstrate understanding of compression bandaging for home program.    Status  Achieved      OT LONG TERM GOAL #3   Title  Patient will demonstrate decrease in  circumferential measurements by 2 cm from wrist, forearm, elbow and upper arm.    Status  Achieved      OT LONG TERM GOAL #4   Title  Patient to be independent in use of day time and night time garments for management of lymphedema symptoms.    Baseline  Just got measureed last week for new JObst Elvarex soft sleeve and glove    Time  8    Period  Weeks    Status  On-going    Target Date  01/02/20            Plan - 11/07/19 1042    Clinical Impression Statement  Pt R UE did decongest some more with using the pump this past week at least one time day - elbow, forearm and wrist decreased - pt was measured for her daytime JObst Elvarex soft sleeve and glove - was able to tighten up her Lelan Pons and husband to calibrate the Reidsleeve again - pt to call when daytime sleeve arrive to be check    OT Occupational Profile and History  Detailed Assessment- Review of Records and additional review of physical, cognitive, psychosocial history related to current functional performance    Occupational performance deficits (Please refer to evaluation for details):  ADL's;IADL's;Work;Leisure    Body Structure / Function / Physical Skills  ADL;Strength;Pain;Edema;UE functional use;IADL;ROM    Psychosocial Skills  Environmental  Adaptations;Routines and Behaviors;Habits;Coping Strategies    Rehab Potential  Excellent    Clinical Decision Making  Limited treatment options, no task modification necessary    Comorbidities Affecting Occupational Performance:  May have comorbidities impacting occupational performance    Modification or Assistance to Complete Evaluation   No modification of tasks or assist necessary to complete eval    OT Frequency  Biweekly    OT Duration  8 weeks    OT Treatment/Interventions  Self-care/ADL training;DME and/or AE instruction;Manual lymph drainage;Compression bandaging;Therapeutic exercise;Coping strategies training;Manual Therapy;Patient/family education    Plan  assess  circumference progress with use of compression garments,pump and fit of new sleeve and glove    Consulted and Agree with Plan of Care  Patient       Patient will benefit from skilled therapeutic intervention in order to improve the following deficits and impairments:   Body Structure /  Function / Physical Skills: ADL, Strength, Pain, Edema, UE functional use, IADL, ROM   Psychosocial Skills: Environmental  Adaptations, Routines and Behaviors, Habits, Coping Strategies   Visit Diagnosis: Lymphedema, not elsewhere classified - Plan: Ot plan of care cert/re-cert  Postmastectomy lymphedema syndrome - Plan: Ot plan of care cert/re-cert    Problem List Patient Active Problem List   Diagnosis Date Noted  . Breast cancer of lower-outer quadrant of right female breast ([place]) 08/18/2011    Rosalyn Gess OTR/L,CLT 11/07/2019, 10:53 AM  [facility] [address], [place], [address] Phone: [phone]   Fax:  [phone]  Name: Jessina DENINE PLOUFF MRN: [id number] Date of Birth: Aug 05, 1960

## 2019-11-20 ENCOUNTER — Encounter: Payer: Self-pay | Admitting: Occupational Therapy

## 2019-11-20 ENCOUNTER — Other Ambulatory Visit: Payer: Self-pay

## 2019-11-20 ENCOUNTER — Ambulatory Visit: Payer: BC Managed Care – PPO | Admitting: Occupational Therapy

## 2019-11-20 DIAGNOSIS — I890 Lymphedema, not elsewhere classified: Secondary | ICD-10-CM

## 2019-11-20 DIAGNOSIS — I972 Postmastectomy lymphedema syndrome: Secondary | ICD-10-CM

## 2019-11-20 NOTE — Therapy (Signed)
[facility] [address] [place], [address] Phone: [phone]   Fax:  [phone]  Occupational Therapy Treatment  Patient Details  Name: Tina Potter MRN: [id number] Date of Birth: Oct 04, 1960 No data recorded  Encounter Date: 11/20/2019  OT End of Session - 11/20/19 1837    Visit Number  7    Number of Visits  10    Date for OT Re-Evaluation  01/01/20    OT Start Time  1550    OT Stop Time  1615    OT Time Calculation (min)  25 min    Activity Tolerance  Patient tolerated treatment well    Behavior During Therapy  [facility] for tasks assessed/performed       Past Medical History:  Diagnosis Date  . Breast cancer ([place])   . Breast cancer ([place]) 08/18/2011  . Lymphedema of arm   . Personal history of chemotherapy 2009  . Personal history of radiation therapy 2009    Past Surgical History:  Procedure Laterality Date  . BILATERAL OOPHORECTOMY  12/2008  . BREAST BIOPSY Right 03/05/2008   x2  . BREAST BIOPSY Left 04/07/2010  . BREAST BIOPSY Left 06/06/2014  . MASTECTOMY Right 2009  . MASTECTOMY, MODIFIED RADICAL W/RECONSTRUCTION  01/2008    There were no vitals filed for this visit.  Subjective Assessment - 11/20/19 1553    Subjective   Patient reports she still has not received her new compression sleeve and glove, she did call this week and they said it should be in by the end of the week.  She is planning to be out of town for the weekend and may have to pick it up next week.    Pertinent History  Onset of cancer in 2009. 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 Mastectomy with right breast with reconstruction.  She was diagnosed with lymphedema in the past and has had treatment but now has Recent cellulitis, negative for DVT    Patient Stated Goals  Patient reports she would like to manage her edema in her arm.    Currently in Pain?  No/denies    Pain Score  0-No pain           LYMPHEDEMA/ONCOLOGY QUESTIONNAIRE - 11/20/19 1553      Right Upper Extremity Lymphedema   15 cm Proximal to Olecranon Process  34.6 cm    10 cm Proximal to Olecranon Process  34.4 cm    Olecranon Process  29.5 cm    15 cm Proximal to Ulnar Styloid Process  27.2 cm    10 cm Proximal to Ulnar Styloid Process  23.8 cm    Just Proximal to Ulnar Styloid Process  18.1 cm    Across Hand at [employer]  19.8 cm    At [place] of 2nd Digit  6.4 cm    At [facility] of Thumb  6.8 cm        Compression bandaging and tribute sleeve removed this date followed by circumferential measurements of left UE with comparison to past measurements.  Patient with a slight increase in digits however other measurements consistently decreased from wrist to upper arm.   Patient did not receive new compression sleeve and glove yet and has called and informed the order should be in by the end of this week.  Patient to pick up sleeve when available and wear for a few days and will come back in for recheck.  She is using pump for one hour a day minimum and continues to wear tribute for periods of time during the day with compression bandage over tribute.  Husband assisting with applying compression at home.   Patient to continue with current program and will call after garments are received for further followup.                  OT Education - 11/20/19 1837    Education Details  home program for managing symptoms of lymphedema    Person(s) Educated  Patient    Methods  Explanation    Comprehension  Verbalized understanding          OT Long Term Goals - 11/07/19 1050      OT LONG TERM GOAL #1   Title  Patient will demonstrate understanding of home program including exercises, skin care, MLD and bandaging for management of lymphedema symptoms.    Status  Achieved      OT LONG TERM GOAL #2   Title  Patient and husband will demonstrate understanding of compression bandaging for home program.     Status  Achieved      OT LONG TERM GOAL #3   Title  Patient will demonstrate decrease in circumferential measurements by 2 cm from wrist, forearm, elbow and upper arm.    Status  Achieved      OT LONG TERM GOAL #4   Title  Patient to be independent in use of day time and night time garments for management of lymphedema symptoms.    Baseline  Just got measureed last week for new JObst Elvarex soft sleeve and glove    Time  8    Period  Weeks    Status  On-going    Target Date  01/02/20            Plan - 11/20/19 1839    Clinical Impression Statement  Patient continues to decongest with use of Reid sleeve at night and tribute during the day with added compression bandaging over top of garment.  Patient still waiting for new compression sleeve and glove to come in this week.  Will follow up with patient after garments are received to check fit and measurements.    OT Occupational Profile and History  Detailed Assessment- Review of Records and additional review of physical, cognitive, psychosocial history related to current functional performance    Occupational performance deficits (Please refer to evaluation for details):  ADL's;IADL's;Work;Leisure    Body Structure / Function / Physical Skills  ADL;Strength;Pain;Edema;UE functional use;IADL;ROM    Psychosocial Skills  Environmental  Adaptations;Routines and Behaviors;Habits;Coping Strategies    Rehab Potential  Excellent    Clinical Decision Making  Limited treatment options, no task modification necessary    Comorbidities Affecting Occupational Performance:  May have comorbidities impacting occupational performance    Modification or Assistance to Complete Evaluation   No modification of tasks or assist necessary to complete eval    OT Frequency  Biweekly    OT Duration  8 weeks    OT Treatment/Interventions  Self-care/ADL training;DME and/or AE instruction;Manual lymph drainage;Compression bandaging;Therapeutic exercise;Coping  strategies training;Manual Therapy;Patient/family education    Plan  assess circumference progress with use of compression garments,pump and fit of new sleeve and glove    Consulted and Agree with Plan of Care  Patient       Patient will benefit from skilled therapeutic intervention in order to improve the following deficits and impairments:   Body Structure /  Function / Physical Skills: ADL, Strength, Pain, Edema, UE functional use, IADL, ROM   Psychosocial Skills: Environmental  Adaptations, Routines and Behaviors, Habits, Coping Strategies   Visit Diagnosis: Lymphedema, not elsewhere classified  Postmastectomy lymphedema syndrome    Problem List Patient Active Problem List   Diagnosis Date Noted  . Breast cancer of lower-outer quadrant of right female breast ([place]) 08/18/2011   Delona Clasby T Philippe Gang, OTR/L, CLT  Shaquoia Miers 11/20/2019, 6:47 PM  [facility] [address], [place], [address] Phone: [phone]   Fax:  [phone]  Name: Tina Potter MRN: [id number] Date of Birth: [birth year]

## 2019-12-09 ENCOUNTER — Encounter: Payer: Self-pay | Admitting: *Deleted

## 2019-12-09 NOTE — Progress Notes (Signed)
successfully faxed signed mastectomy bra order form to [facility] [phone].

## 2019-12-13 ENCOUNTER — Other Ambulatory Visit: Payer: Self-pay

## 2019-12-13 ENCOUNTER — Ambulatory Visit: Payer: BC Managed Care – PPO | Attending: Medical | Admitting: Occupational Therapy

## 2019-12-13 DIAGNOSIS — I890 Lymphedema, not elsewhere classified: Secondary | ICD-10-CM | POA: Diagnosis not present

## 2019-12-13 DIAGNOSIS — I972 Postmastectomy lymphedema syndrome: Secondary | ICD-10-CM | POA: Diagnosis present

## 2019-12-13 NOTE — Therapy (Signed)
[place] [facility] [address] [place], [address] Phone: [phone]   Fax:  [phone]  Occupational Therapy Treatment  Patient Details  Name: Briell TANIELLE EMIGH MRN: [id number] Date of Birth: [birth year] No data recorded  Encounter Date: 12/13/2019   OT End of Session - 12/13/19 1249    Visit Number 8    Number of Visits 10    Date for OT Re-Evaluation 01/01/20    OT Start Time 1215    OT Stop Time 1238    OT Time Calculation (min) 23 min    Activity Tolerance Patient tolerated treatment well    Behavior During Therapy [facility] for tasks assessed/performed           Past Medical History:  Diagnosis Date  . Breast cancer ([place])   . Breast cancer ([place]) 08/18/2011  . Lymphedema of arm   . Personal history of chemotherapy 2009  . Personal history of radiation therapy 2009    Past Surgical History:  Procedure Laterality Date  . BILATERAL OOPHORECTOMY  12/2008  . BREAST BIOPSY Right 03/05/2008   x2  . BREAST BIOPSY Left 04/07/2010  . BREAST BIOPSY Left 06/06/2014  . MASTECTOMY Right 2009  . MASTECTOMY, MODIFIED RADICAL W/RECONSTRUCTION  01/2008    There were no vitals filed for this visit.   Subjective Assessment - 12/13/19 1248    Subjective  I got my compression sleeve about 3 wks ago - doing pump in PM , Reidsleeve night time and then daysleeve - feels okay    Pertinent History Onset of cancer in 2009. 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 Mastectomy with right breast with reconstruction.  She was diagnosed with lymphedema in the past and has had treatment but now has Recent cellulitis, negative for DVT    Patient Stated Goals Patient reports she would like to manage her edema in her arm.    Currently in Pain? No/denies               LYMPHEDEMA/ONCOLOGY QUESTIONNAIRE - 12/13/19 0001      Right Upper Extremity Lymphedema   15 cm Proximal to Olecranon Process 34 cm    10 cm Proximal to  Olecranon Process 34 cm    Olecranon Process 28.8 cm    15 cm Proximal to Ulnar Styloid Process 27.7 cm    10 cm Proximal to Ulnar Styloid Process 24 cm    Just Proximal to Ulnar Styloid Process 18 cm    Across Hand at [employer] 20 cm    At [place] of 2nd Digit 6.4 cm    At [facility] of Thumb 6.5 cm            Pt arrive with her daytime jobst Elvarex soft sleeve and glove - appear to fit well  Pt ed on donning , doffting and wearing correctly   And report she is using her pump only in PM- not time in am with work And then [employer] Classic night time -  Measurement show decrease in elbow to upper arm - but proximal forearm increase -and compare to other areas - increase 1.9 cm when compare to L side -  Pt and husband to check again Reidsleeve calibration and gradient pressure from forearm to upper arm                OT Education - 12/13/19 1249    Education Details changes to compression    Person(s) Educated  Patient    Methods Explanation    Comprehension Verbalized understanding;Verbal cues required               OT Long Term Goals - 11/07/19 1050      OT LONG TERM GOAL #1   Title Patient will demonstrate understanding of home program including exercises, skin care, MLD and bandaging for management of lymphedema symptoms.    Status Achieved      OT LONG TERM GOAL #2   Title Patient and husband will demonstrate understanding of compression bandaging for home program.    Status Achieved      OT LONG TERM GOAL #3   Title Patient will demonstrate decrease in circumferential measurements by 2 cm from wrist, forearm, elbow and upper arm.    Status Achieved      OT LONG TERM GOAL #4   Title Patient to be independent in use of day time and night time garments for management of lymphedema symptoms.    Baseline Just got measureed last week for new JObst Elvarex soft sleeve and glove    Time 8    Period Weeks    Status On-going    Target Date 01/02/20                   Plan - 12/13/19 1250    Clinical Impression Statement Pt doing well using Reidsleeve Classic night time - and pneumatic pump evening - not time in am - and then next Jobst Elvarez soft sleeve and glove -appear to fit well - pt to proximal forearm increase  compare to elbow and upper arm increase - pt to redo callibration on Reidsleeve to make sure gradient pressure is correct and will reassess in week    OT Occupational Profile and History Detailed Assessment- Review of Records and additional review of physical, cognitive, psychosocial history related to current functional performance    Occupational performance deficits (Please refer to evaluation for details): ADL's;IADL's;Work;Leisure    Body Structure / Function / Physical Skills ADL;Strength;Pain;Edema;UE functional use;IADL;ROM    Psychosocial Skills Environmental  Adaptations;Routines and Behaviors;Habits;Coping Strategies    Rehab Potential Excellent    Clinical Decision Making Limited treatment options, no task modification necessary    Comorbidities Affecting Occupational Performance: May have comorbidities impacting occupational performance    Modification or Assistance to Complete Evaluation  No modification of tasks or assist necessary to complete eval    OT Frequency 1x / week    OT Duration --   3 wks   OT Treatment/Interventions Self-care/ADL training;DME and/or AE instruction;Manual lymph drainage;Compression bandaging;Therapeutic exercise;Coping strategies training;Manual Therapy;Patient/family education    Plan assess in weeks time to see if forearm decrease with adjusting callibration Reidsleeve    Consulted and Agree with Plan of Care Patient           Patient will benefit from skilled therapeutic intervention in order to improve the following deficits and impairments:   Body Structure / Function / Physical Skills: ADL, Strength, Pain, Edema, UE functional use, IADL, ROM   Psychosocial Skills:  Environmental  Adaptations, Routines and Behaviors, Habits, Coping Strategies   Visit Diagnosis: Lymphedema, not elsewhere classified  Postmastectomy lymphedema syndrome    Problem List Patient Active Problem List   Diagnosis Date Noted  . Breast cancer of lower-outer quadrant of right female breast ([place]) 08/18/2011    Rosalyn Gess OTR/L,CLT 12/13/2019, 1:03 PM  [place] [facility] [address], [place],  [place] Phone: [phone]   Fax:  [phone]  Name: Jerica ROVENA HEARLD MRN: [id number] Date of Birth: 1960/07/12

## 2019-12-20 ENCOUNTER — Other Ambulatory Visit: Payer: Self-pay

## 2019-12-20 ENCOUNTER — Ambulatory Visit: Payer: BC Managed Care – PPO | Admitting: Occupational Therapy

## 2019-12-20 DIAGNOSIS — I890 Lymphedema, not elsewhere classified: Secondary | ICD-10-CM | POA: Diagnosis not present

## 2019-12-20 DIAGNOSIS — I972 Postmastectomy lymphedema syndrome: Secondary | ICD-10-CM

## 2019-12-20 NOTE — Therapy (Signed)
[place] [facility] [address] [place], [address] Phone: [phone]   Fax:  [phone]  Occupational Therapy Treatment  Patient Details  Name: Tina Potter MRN: [id number] Date of Birth: Jan 14, 1961 No data recorded  Encounter Date: 12/20/2019   OT End of Session - 12/20/19 0949    Visit Number 9    Number of Visits 10    Date for OT Re-Evaluation 01/01/20    OT Start Time 0923    OT Stop Time 0942    OT Time Calculation (min) 19 min    Activity Tolerance Patient tolerated treatment well    Behavior During Therapy [facility] for tasks assessed/performed           Past Medical History:  Diagnosis Date   Breast cancer ([place])    Breast cancer ([place]) 08/18/2011   Lymphedema of arm    Personal history of chemotherapy 2009   Personal history of radiation therapy 2009    Past Surgical History:  Procedure Laterality Date   BILATERAL OOPHORECTOMY  12/2008   BREAST BIOPSY Right 03/05/2008   x2   BREAST BIOPSY Left 04/07/2010   BREAST BIOPSY Left 06/06/2014   MASTECTOMY Right 2009   MASTECTOMY, MODIFIED RADICAL W/RECONSTRUCTION  01/2008    There were no vitals filed for this visit.   Subjective Assessment - 12/20/19 0949    Subjective  I think my forearm is still increase - did not callibrate my Reidsleeve this past week    Pertinent History Onset of cancer in 2009. 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 Mastectomy with right breast with reconstruction.  She was diagnosed with lymphedema in the past and has had treatment but now has Recent cellulitis, negative for DVT    Patient Stated Goals Patient reports she would like to manage her edema in her arm.    Currently in Pain? No/denies               LYMPHEDEMA/ONCOLOGY QUESTIONNAIRE - 12/20/19 0001      Right Upper Extremity Lymphedema   15 cm Proximal to Olecranon Process 34.4 cm    10 cm Proximal to Olecranon Process 34 cm     Olecranon Process 28.8 cm    15 cm Proximal to Ulnar Styloid Process 27.7 cm    10 cm Proximal to Ulnar Styloid Process 24.6 cm    Just Proximal to Ulnar Styloid Process 18.4 cm    Across Hand at [employer] 19.8 cm    At [place] of 2nd Digit 6 cm    At [facility] of Thumb 6.4 cm             Pt arrive with her daytime jobst Elvarex soft sleeve and glove - appear to fit well still- but did not had it all the way up  Pt ed on donning , doffting and wearing correctly   And report she used her pump 4 x this past week - 1 x day in PM  not time in am with work And then [employer] Classic night time -  Measurement show still decrease in hand and upper arm - but forearm increase at the distal one to by 0.6 cm and proximal one still increased - she did not callibrate the Reidsleeve past week Reinforce importance -and husband will do tonight  Pt also to pump 2 x day over weekend  Return in week  OT Education - 12/20/19 0949    Education Details changes to compression    Person(s) Educated Patient;Spouse    Methods Explanation    Comprehension Verbalized understanding;Verbal cues required               OT Long Term Goals - 11/07/19 1050      OT LONG TERM GOAL #1   Title Patient will demonstrate understanding of home program including exercises, skin care, MLD and bandaging for management of lymphedema symptoms.    Status Achieved      OT LONG TERM GOAL #2   Title Patient and husband will demonstrate understanding of compression bandaging for home program.    Status Achieved      OT LONG TERM GOAL #3   Title Patient will demonstrate decrease in circumferential measurements by 2 cm from wrist, forearm, elbow and upper arm.    Status Achieved      OT LONG TERM GOAL #4   Title Patient to be independent in use of day time and night time garments for management of lymphedema symptoms.    Baseline Just got measureed last week for new JObst Elvarex soft sleeve and  glove    Time 8    Period Weeks    Status On-going    Target Date 01/02/20                 Plan - 12/20/19 0950    Clinical Impression Statement Pt doing same with daytime compression sleeve and Reidsleeve at night time - but did not callibrate the Reidsleeve and used pump only 1 x day for 4 days- pt forearm circumference increase at both places - reinforce to use pump 2 x day over weekend and then callibrate Reidsleeve for gradient because upper arm decreased last time - pt to return in week again    OT Occupational Profile and History Detailed Assessment- Review of Records and additional review of physical, cognitive, psychosocial history related to current functional performance    Occupational performance deficits (Please refer to evaluation for details): ADL's;IADL's;Work;Leisure    Body Structure / Function / Physical Skills ADL;Strength;Pain;Edema;UE functional use;IADL;ROM    Psychosocial Skills Environmental  Adaptations;Routines and Behaviors;Habits;Coping Strategies    Rehab Potential Excellent    Clinical Decision Making Limited treatment options, no task modification necessary    Comorbidities Affecting Occupational Performance: May have comorbidities impacting occupational performance    Modification or Assistance to Complete Evaluation  No modification of tasks or assist necessary to complete eval    OT Frequency 1x / week    OT Duration 2 weeks    OT Treatment/Interventions Self-care/ADL training;DME and/or AE instruction;Manual lymph drainage;Compression bandaging;Therapeutic exercise;Coping strategies training;Manual Therapy;Patient/family education    Plan assess in week  to see if forearm decrease with adjusting callibration Reidsleeve    Consulted and Agree with Plan of Care Patient           Patient will benefit from skilled therapeutic intervention in order to improve the following deficits and impairments:   Body Structure / Function / Physical Skills: ADL,  Strength, Pain, Edema, UE functional use, IADL, ROM   Psychosocial Skills: Environmental  Adaptations, Routines and Behaviors, Habits, Coping Strategies   Visit Diagnosis: Lymphedema, not elsewhere classified  Postmastectomy lymphedema syndrome    Problem List Patient Active Problem List   Diagnosis Date Noted   Breast cancer of lower-outer quadrant of right female breast ([place]) 08/18/2011    Rosalyn Gess OTR/L,CLT 12/20/2019, 9:52 AM  [facility]  [place] [facility] [address], [place], [address] Phone: [phone]   Fax:  [phone]  Name: Tina Potter MRN: [id number] Date of Birth: [birth year]

## 2019-12-26 ENCOUNTER — Other Ambulatory Visit: Payer: Self-pay

## 2019-12-26 ENCOUNTER — Ambulatory Visit: Payer: BC Managed Care – PPO | Attending: Medical | Admitting: Occupational Therapy

## 2019-12-26 DIAGNOSIS — I972 Postmastectomy lymphedema syndrome: Secondary | ICD-10-CM

## 2019-12-26 DIAGNOSIS — I890 Lymphedema, not elsewhere classified: Secondary | ICD-10-CM | POA: Diagnosis not present

## 2019-12-26 NOTE — Therapy (Signed)
[place] [facility] [address] [place], [address] Phone: [phone]   Fax:  [phone]  Occupational Therapy Treatment  Patient Details  Name: Tina Potter MRN: [id number] Date of Birth: [birth year] No data recorded  Encounter Date: 12/26/2019   OT End of Session - 12/26/19 1550    Visit Number 10    Number of Visits 10    Date for OT Re-Evaluation 12/26/19    OT Start Time 1522    OT Stop Time 1545    OT Time Calculation (min) 23 min    Activity Tolerance Patient tolerated treatment well    Behavior During Therapy [facility] for tasks assessed/performed           Past Medical History:  Diagnosis Date  . Breast cancer ([place])   . Breast cancer ([place]) 08/18/2011  . Lymphedema of arm   . Personal history of chemotherapy 2009  . Personal history of radiation therapy 2009    Past Surgical History:  Procedure Laterality Date  . BILATERAL OOPHORECTOMY  12/2008  . BREAST BIOPSY Right 03/05/2008   x2  . BREAST BIOPSY Left 04/07/2010  . BREAST BIOPSY Left 06/06/2014  . MASTECTOMY Right 2009  . MASTECTOMY, MODIFIED RADICAL W/RECONSTRUCTION  01/2008    There were no vitals filed for this visit.   Subjective Assessment - 12/26/19 1549    Subjective  My husband done calibrated my Reidsleeve that day when we left here and did my pump over the weekend 2 x and this am again    Pertinent History Onset of cancer in 2009. 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 Mastectomy with right breast with reconstruction.  She was diagnosed with lymphedema in the past and has had treatment but now has Recent cellulitis, negative for DVT    Patient Stated Goals Patient reports she would like to manage her edema in her arm.    Currently in Pain? No/denies               LYMPHEDEMA/ONCOLOGY QUESTIONNAIRE - 12/26/19 0001      Right Upper Extremity Lymphedema   15 cm Proximal to Olecranon Process 33.5 cm    10 cm Proximal  to Olecranon Process 33.5 cm    Olecranon Process 28.3 cm    15 cm Proximal to Ulnar Styloid Process 27.2 cm    10 cm Proximal to Ulnar Styloid Process 23.5 cm    Just Proximal to Ulnar Styloid Process 18 cm    Across Hand at [employer] 19.5 cm    At [place] of 2nd Digit 6 cm    At [facility] of [place] 6.3 cm      Left Upper Extremity Lymphedema   15 cm Proximal to Olecranon Process 33 cm    10 cm Proximal to Olecranon Process 31.6 cm    Olecranon Process 26.5 cm    15 cm Proximal to Ulnar Styloid Process 24.5 cm    10 cm Proximal to Ulnar Styloid Process 21.4 cm    Just Proximal to Ulnar Styloid Process 16.8 cm    Across Hand at [employer] 18.8 cm    At [place] of 2nd Digit 6 cm    At [facility] of Thumb 6.3 cm                    Pt arrive with her daytime jobst Elvarex soft sleeve and glove - appear to fit well still- Pt ed  on donning , doffting and wearing correctly  Pt report since March she lost 10 lbs - pt ed on replacement of her daytime compression if loose weight more than 10 lbs  And calibrate Reidsleeve again if happens   And report she used her pump 2 x day over weekend -and 1 x this week each day - except did pump this am   And then Reidsleeve Classic night time - Husband did calibrate it this past weekend -and show decrease circumference the whole R arm  Pt's R arm decongest greatly from [facility] -and independent in Home program to maintain her circumference and prevent future cellulitis and infections          OT Education - 12/26/19 1549    Education Details discharge instructions    Person(s) Educated Patient;Spouse    Methods Explanation    Comprehension Verbalized understanding;Verbal cues required               OT Long Term Goals - 12/26/19 1551      OT LONG TERM GOAL #1   Title Patient will demonstrate understanding of home program including exercises, skin care, MLD and bandaging for management of lymphedema symptoms.    Status Achieved       OT LONG TERM GOAL #2   Title Patient and husband will demonstrate understanding of compression bandaging for home program.    Status Achieved      OT LONG TERM GOAL #3   Title Patient will demonstrate decrease in circumferential measurements by 2 cm from wrist, forearm, elbow and upper arm.    Status Achieved      OT LONG TERM GOAL #4   Title Patient to be independent in use of day time and night time garments for management of lymphedema symptoms.    Status Achieved                 Plan - 12/26/19 1550    Clinical Impression Statement Pt made great progress in decongesting of L UE lymphedema circumference  - decrease about 2 cm thru out the arm - pt able to maintain her circumference  and prevent future flare ups  and cellulitis episodes with daytime Jobst [employer] soft sleeve and glove ,Reidsleeve at night time and pump 1 x 2 x  day am and pm - pt independent in HEP - discharg at this time    OT Occupational Profile and History Detailed Assessment- Review of Records and additional review of physical, cognitive, psychosocial history related to current functional performance    Occupational performance deficits (Please refer to evaluation for details): ADL's;IADL's;Work;Leisure    Body Structure / Function / Physical Skills ADL;Strength;Pain;Edema;UE functional use;IADL;ROM    Psychosocial Skills Environmental  Adaptations;Routines and Behaviors;Habits;Coping Strategies    Rehab Potential Excellent    Clinical Decision Making Limited treatment options, no task modification necessary    Comorbidities Affecting Occupational Performance: May have comorbidities impacting occupational performance    Modification or Assistance to Complete Evaluation  No modification of tasks or assist necessary to complete eval    OT Treatment/Interventions Self-care/ADL training;DME and/or AE instruction;Manual lymph drainage;Compression bandaging;Therapeutic exercise;Coping strategies training;Manual  Therapy;Patient/family education    Plan discharge instructions    Consulted and Agree with Plan of Care Patient           Patient will benefit from skilled therapeutic intervention in order to improve the following deficits and impairments:   Body Structure / Function / Physical Skills: ADL, Strength, Pain, Edema, UE  functional use, IADL, ROM   Psychosocial Skills: Environmental  Adaptations, Routines and Behaviors, Habits, Coping Strategies   Visit Diagnosis: Lymphedema, not elsewhere classified  Postmastectomy lymphedema syndrome    Problem List Patient Active Problem List   Diagnosis Date Noted  . Breast cancer of lower-outer quadrant of right female breast ([place]) 08/18/2011    Rosalyn Gess OTR/l,CLT  12/26/2019, 3:58 PM  [place] [facility] [address] [place], [address] Phone: [phone]   Fax:  [phone]  Name: Tina Potter MRN: [id number] Date of Birth: 05/22/1961

## 2020-02-13 ENCOUNTER — Encounter: Payer: Self-pay | Admitting: Genetic Counselor

## 2020-03-25 NOTE — Progress Notes (Signed)
Patient Care Team: Patient, No Pcp Per as PCP - General (General Practice)  DIAGNOSIS:    ICD-10-CM   1. 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 breast of female, estrogen receptor positive ([place])  C50.511    Z17.0     SUMMARY OF ONCOLOGIC HISTORY: Oncology History Overview Note  Tamoxifen 20 mg daily stopped   Breast cancer of lower-outer quadrant of right female breast ([place])  03/17/2008 Surgery   right modified radical mastectomy with excellent lymph node dissection for a stage IIB right breast cancer   04/22/2008 - 08/05/2008 Chemotherapy   Taxotere Adriamycin and Cytoxan for a total of 6 cycles    10/02/2008 - 10/15/2008 Radiation Therapy   Adj XRT   11/04/2008 - 01/13/2009 Anti-estrogen oral therapy   tamoxifen 20 mg daily but this was discontinued   12/08/2008 Surgery   BSO   01/07/2009 -  Anti-estrogen oral therapy   Letrozole, plan for 10 years   05/03/2009 Surgery   right tram flap breast reconstruction; BRCA 1,2 Neg     CHIEF COMPLIANT: Follow-up of right breast cancer on letrozole  INTERVAL HISTORY: Tina Potter is a 59 y.o. with above-mentioned history of right breast cancer treated with right mastectomy and reconstruction, adjuvant chemotherapy, radiation, and who is currently on anti-estrogen therapy with letrozole. Mammogram on 09/05/19 showed no evidence of malignancy bilaterally. She presents to the clinic today for annual follow-up.    ALLERGIES:  has No Known Allergies.  MEDICATIONS:  Current Outpatient Medications  Medication Sig Dispense Refill  . ibuprofen (ADVIL) 800 MG tablet     . sulfamethoxazole-trimethoprim (BACTRIM DS) 800-160 MG tablet Take 1 tablet by mouth 2 (two) times daily. (Patient not taking: Reported on 09/24/2019) 14 tablet 0  . venlafaxine XR (EFFEXOR-XR) 37.5 MG 24 hr capsule TAKE 1 CAPSULE BY MOUTH EVERY MORNING 90 capsule 3   No current facility-administered medications for this visit.    PHYSICAL EXAMINATION: ECOG  PERFORMANCE STATUS: 1 - Symptomatic but completely ambulatory  Vitals:   03/26/20 1409  BP: (!) 147/77  Pulse: 80  Resp: 18  Temp: 97.8 F (36.6 C)  SpO2: 99%   Filed Weights   03/26/20 1409  Weight: 186 lb 14.4 oz (84.8 kg)    BREAST: No palpable masses or nodules in either right or left breasts. No palpable axillary supraclavicular or infraclavicular adenopathy no breast tenderness or nipple discharge. (exam performed in the presence of a chaperone)  LABORATORY DATA:  I have reviewed the data as listed CMP Latest Ref Rng & Units 06/03/2014 08/05/2013 01/31/2013  Glucose 70 - 140 mg/dl 90 87 96  BUN 7.0 - 26.0 mg/dL 12.5 16.4 12.2  Creatinine 0.6 - 1.1 mg/dL 0.7 0.7 0.8  Sodium 136 - 145 mEq/L 142 142 144  Potassium 3.5 - 5.1 mEq/L 3.8 3.9 4.0  Chloride 98 - 107 mEq/L - - -  CO2 22 - 29 mEq/L _0 Calcium 8.4 - 10.4 mg/dL 9.5 9.7 10.0  Total Protein 6.4 - 8.3 g/dL 7.1 7.3 7.6  Total Bilirubin 0.20 - 1.20 mg/dL 0.38 0.32 0.29  Alkaline Phos 40 - 150 U/L 58 54 56  AST 5 - 34 U/L _1 ALT 0 - 55 U/L _2 Lab Results  Component Value Date   WBC 5.1 06/03/2014   HGB 13.4 06/03/2014   HCT 40.6 06/03/2014   MCV 91.1 06/03/2014   PLT 238 06/03/2014   NEUTROABS 2.6 06/03/2014  ASSESSMENT & PLAN:  Breast cancer of lower-outer quadrant of right female breast Right breast cancer stage IIB status post right modified radical mastectomy with axillary lymph node dissection September 2009 treated with adjuvant chemotherapy with TAC x6 cycles and post mastectomy radiation completed 10/15/2008, given tamoxifen from 2007 to 2010 and underwent bilateral salpingo-oophorectomies and start anastrozole 01/14/2009 completed 03/25/2019, undergone TRAM flap reconstruction.  Osteopenia bone density done in November 2015 T score -1.3: Repeat bone density 08/03/2018: T score -1.3 unchanged: On vitamin D supplement    Weight: Patient has cut down the food intake and exercising  regularly and has reduced her weight. Working from home has made it difficult for her to lose weight.  Breast Cancer Surveillance: 1. Breast exam  03/26/2020: Normal 2. Mammogram  3/12/2021benign, breast density category C  Return to clinic in 1 year for follow-up    No orders of the defined types were placed in this encounter.  The patient has a good understanding of the overall plan. she agrees with it. she will call with any problems that may develop before the next visit here.  Total time spent: 30 mins including face to face time and time spent for planning, charting and coordination of care  Nicholas Lose, MD 03/26/2020  I, Cloyde Reams Dorshimer, am acting as scribe for Dr. Nicholas Lose.  I have reviewed the above documentation for accuracy and completeness, and I agree with the above.

## 2020-03-26 ENCOUNTER — Other Ambulatory Visit: Payer: Self-pay

## 2020-03-26 ENCOUNTER — Inpatient Hospital Stay: Payer: BC Managed Care – PPO | Attending: Hematology and Oncology | Admitting: Hematology and Oncology

## 2020-03-26 DIAGNOSIS — C50511 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 female breast: Secondary | ICD-10-CM | POA: Diagnosis present

## 2020-03-26 DIAGNOSIS — Z79899 Other long term (current) drug therapy: Secondary | ICD-10-CM | POA: Diagnosis not present

## 2020-03-26 DIAGNOSIS — Z79811 Long term (current) use of aromatase inhibitors: Secondary | ICD-10-CM | POA: Insufficient documentation

## 2020-03-26 DIAGNOSIS — M8580 Other specified disorders of bone density and structure, unspecified site: Secondary | ICD-10-CM | POA: Diagnosis not present

## 2020-03-26 DIAGNOSIS — Z923 Personal history of irradiation: Secondary | ICD-10-CM | POA: Diagnosis not present

## 2020-03-26 DIAGNOSIS — Z170 Estrogen receptor positive status [ER+]: Secondary | ICD-10-CM | POA: Diagnosis not present

## 2020-03-26 DIAGNOSIS — Z9011 Acquired absence of right breast and nipple: Secondary | ICD-10-CM | POA: Diagnosis not present

## 2020-03-26 DIAGNOSIS — Z791 Long term (current) use of non-steroidal anti-inflammatories (NSAID): Secondary | ICD-10-CM | POA: Diagnosis not present

## 2020-03-26 DIAGNOSIS — Z9221 Personal history of antineoplastic chemotherapy: Secondary | ICD-10-CM | POA: Insufficient documentation

## 2020-03-26 MED ORDER — VENLAFAXINE HCL ER 37.5 MG PO CP24: ORAL_CAPSULE | ORAL | 3 refills | Status: DC

## 2020-03-26 NOTE — Assessment & Plan Note (Signed)
Right breast cancer stage IIB status post right modified radical mastectomy with axillary lymph node dissection September 2009 treated with adjuvant chemotherapy with TAC x6 cycles and post mastectomy radiation completed 10/15/2008, given tamoxifen from 2007 to 2010 and underwent bilateral salpingo-oophorectomies and start anastrozole 01/14/2009 completed 03/25/2019, undergone TRAM flap reconstruction.  Osteopenia bone density done in November 2015 T score -1.3: Repeat bone density 08/03/2018: T score -1.3 unchanged I encouraged her to take vitamin D.  We discussed the importance of exercise and watching her weight.  Her BMI is over 30.  Our goal is to bring it down to below 30 by next year.  Breast Cancer Surveillance: 1. Breast exam  03/26/2020: Normal 2. Mammogram  3/12/2021benign, breast density category C  Return to clinic in 1 year for follow-up

## 2020-03-27 ENCOUNTER — Telehealth: Payer: Self-pay | Admitting: Hematology and Oncology

## 2020-03-27 NOTE — Telephone Encounter (Signed)
Scheduled appts per 9/30 los. Left voicemail with appt date and time.

## 2020-08-21 ENCOUNTER — Other Ambulatory Visit: Payer: Self-pay | Admitting: Hematology and Oncology

## 2020-08-21 DIAGNOSIS — Z1231 Encounter for screening mammogram for malignant neoplasm of breast: Secondary | ICD-10-CM

## 2020-10-12 ENCOUNTER — Ambulatory Visit: Payer: BC Managed Care – PPO

## 2020-12-08 ENCOUNTER — Other Ambulatory Visit: Payer: Self-pay

## 2020-12-08 ENCOUNTER — Ambulatory Visit
Admission: RE | Admit: 2020-12-08 | Discharge: 2020-12-08 | Disposition: A | Discharge status: Home / Self Care | Payer: BC Managed Care – PPO | Source: Ambulatory Visit | Attending: Hematology and Oncology | Admitting: Hematology and Oncology

## 2020-12-08 DIAGNOSIS — Z1231 Encounter for screening mammogram for malignant neoplasm of breast: Secondary | ICD-10-CM

## 2021-02-23 ENCOUNTER — Telehealth: Payer: Self-pay

## 2021-02-23 NOTE — Telephone Encounter (Signed)
RN spoke with patient regarding concerns with lymphedema to right arm.  Patient with history of breast cancer, and lymphedema.   Right arm is tight, and feels like a "burning sensation up to elbow."  Patient voiced it is difficult to move arm up, attempting elevation of right arm. Patient using sleeve compression, and machine for lymphedema at home.    RN encouraged limited motion, and elevation of arm.  Patient scheduled for 8/31 evaluation by MD, pt notified.

## 2021-02-23 NOTE — Progress Notes (Signed)
Patient Care Team: Patient, No Pcp Per (Inactive) as PCP - General (General Practice)  DIAGNOSIS:    ICD-10-CM   1. Lymphedema of right upper extremity  I89.0 Ambulatory referral to Physical Therapy      SUMMARY OF ONCOLOGIC HISTORY: Oncology History Overview Note  Tamoxifen 20 mg daily stopped   Breast cancer of lower-outer quadrant of right female breast ([place])  03/17/2008 Surgery   right modified radical mastectomy with excellent lymph node dissection for a stage IIB right breast cancer   04/22/2008 - 08/05/2008 Chemotherapy   Taxotere Adriamycin and Cytoxan for a total of 6 cycles    10/02/2008 - 10/15/2008 Radiation Therapy   Adj XRT   11/04/2008 - 01/13/2009 Anti-estrogen oral therapy   tamoxifen 20 mg daily but this was discontinued   12/08/2008 Surgery   BSO   01/07/2009 -  Anti-estrogen oral therapy   Letrozole, plan for 10 years   05/03/2009 Surgery   right tram flap breast reconstruction; BRCA 1,2 Neg     CHIEF COMPLIANT: Follow-up of right breast cancer on letrozole  INTERVAL HISTORY: Tina Potter is a 60 y.o. with above-mentioned history of right breast cancer treated with right mastectomy and reconstruction, adjuvant chemotherapy, radiation, and who is currently on anti-estrogen therapy with letrozole. Mammogram on 12/08/2020 showed no evidence of malignancy bilaterally. She presents to the clinic today for annual follow-up.  She is complaining of worsening right arm lymphedema along with some slight redness of the arm.  It is also painful to touch.  This has been going on for the last several weeks.   ALLERGIES:  has No Known Allergies.  MEDICATIONS:  Current Outpatient Medications  Medication Sig Dispense Refill   cephALEXin (KEFLEX) 500 MG capsule Take 1 capsule (500 mg total) by mouth 3 (three) times daily for 10 days. 30 capsule 0   ibuprofen (ADVIL) 800 MG tablet      venlafaxine XR (EFFEXOR-XR) 37.5 MG 24 hr capsule TAKE 1 CAPSULE BY MOUTH EVERY MORNING  90 capsule 3   No current facility-administered medications for this visit.    PHYSICAL EXAMINATION: ECOG PERFORMANCE STATUS: 1 - Symptomatic but completely ambulatory  Vitals:   02/24/21 1201  BP: 128/68  Pulse: 88  Resp: 16  Temp: 97.9 F (36.6 C)  SpO2: 99%   Filed Weights   02/24/21 1201  Weight: 187 lb 14.4 oz (85.2 kg)    BREAST: No palpable masses or nodules in either right or left breasts. No palpable axillary supraclavicular or infraclavicular adenopathy no breast tenderness or nipple discharge. (exam performed in the presence of a chaperone), right arm lymphedema with erythema  LABORATORY DATA:  I have reviewed the data as listed CMP Latest Ref Rng & Units 06/03/2014 08/05/2013 01/31/2013  Glucose 70 - 140 mg/dl 90 87 96  BUN 7.0 - 26.0 mg/dL 12.5 16.4 12.2  Creatinine 0.6 - 1.1 mg/dL 0.7 0.7 0.8  Sodium 136 - 145 mEq/L 142 142 144  Potassium 3.5 - 5.1 mEq/L 3.8 3.9 4.0  Chloride 98 - 107 mEq/L - - -  CO2 22 - 29 mEq/L 26 28 28   Calcium 8.4 - 10.4 mg/dL 9.5 9.7 10.0  Total Protein 6.4 - 8.3 g/dL 7.1 7.3 7.6  Total Bilirubin 0.20 - 1.20 mg/dL 0.38 0.32 0.29  Alkaline Phos 40 - 150 U/L 58 54 56  AST 5 - 34 U/L 20 21 23   ALT 0 - 55 U/L 19 22 18     Lab Results  Component Value  Date   WBC 5.1 06/03/2014   HGB 13.4 06/03/2014   HCT 40.6 06/03/2014   MCV 91.1 06/03/2014   PLT 238 06/03/2014   NEUTROABS 2.6 06/03/2014    ASSESSMENT & PLAN:  No problem-specific Assessment & Plan notes found for this encounter.    Orders Placed This Encounter  Procedures   Ambulatory referral to Physical Therapy    Referral Priority:   Routine    Referral Type:   Physical Medicine    Referral Reason:   Specialty Services Required    Requested Specialty:   Physical Therapy    Number of Visits Requested:   1   The patient has a good understanding of the overall plan. she agrees with it. she will call with any problems that may develop before the next visit here.  Total  time spent: 30 mins including face to face time and time spent for planning, charting and coordination of care  Rulon Eisenmenger, MD, MPH 02/24/2021  I, Thana Ates, am acting as scribe for Dr. Nicholas Lose.  I have reviewed the above documentation for accuracy and completeness, and I agree with the above.

## 2021-02-24 ENCOUNTER — Inpatient Hospital Stay: Payer: BC Managed Care – PPO | Attending: Hematology and Oncology | Admitting: Hematology and Oncology

## 2021-02-24 ENCOUNTER — Other Ambulatory Visit: Payer: Self-pay | Admitting: *Deleted

## 2021-02-24 ENCOUNTER — Other Ambulatory Visit: Payer: Self-pay

## 2021-02-24 VITALS — BP 128/68 | HR 88 | Temp 97.9°F | Resp 16 | Ht 67.0 in | Wt 187.9 lb

## 2021-02-24 DIAGNOSIS — Z79899 Other long term (current) drug therapy: Secondary | ICD-10-CM | POA: Insufficient documentation

## 2021-02-24 DIAGNOSIS — Z170 Estrogen receptor positive status [ER+]: Secondary | ICD-10-CM | POA: Diagnosis not present

## 2021-02-24 DIAGNOSIS — Z9011 Acquired absence of right breast and nipple: Secondary | ICD-10-CM | POA: Insufficient documentation

## 2021-02-24 DIAGNOSIS — Z853 Personal history of malignant neoplasm of breast: Secondary | ICD-10-CM | POA: Diagnosis not present

## 2021-02-24 DIAGNOSIS — I890 Lymphedema, not elsewhere classified: Secondary | ICD-10-CM | POA: Insufficient documentation

## 2021-02-24 DIAGNOSIS — C50511 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 female breast: Secondary | ICD-10-CM | POA: Diagnosis not present

## 2021-02-24 MED ORDER — CEPHALEXIN 500 MG PO CAPS: 500.0000 mg | ORAL_CAPSULE | Freq: Three times a day (TID) | ORAL | 0 refills | Status: AC

## 2021-02-24 MED ORDER — VENLAFAXINE HCL ER 37.5 MG PO CP24: ORAL_CAPSULE | ORAL | 3 refills | Status: AC

## 2021-02-24 NOTE — Assessment & Plan Note (Signed)
Right breast cancer stage IIB status post right modified radical mastectomy with axillary lymph node dissection September 2009 treated with adjuvant chemotherapy with TAC x6 cycles and post mastectomy radiation completed 10/15/2008, given tamoxifen from 2007 to 2010 and underwent bilateral salpingo-oophorectomies and start anastrozole 01/14/2009 completed 03/25/2019, undergone TRAM flap reconstruction.  Osteopenia bone density done in November 2015 T score -1.3: Repeat bone density 08/03/2018: T score -1.3 unchanged: On vitamin D supplement   Breast Cancer Surveillance: 1. Breast exam8/31/22: Normal 2. Mammogram 6/16/22benign, breast density category C  Right arm lymphedema: With cellilitis Sent prescription for Keflex TID X 10 days  Return to clinic in 1 year for follow-up

## 2021-03-09 ENCOUNTER — Encounter: Payer: Self-pay | Admitting: Occupational Therapy

## 2021-03-09 ENCOUNTER — Ambulatory Visit: Payer: BC Managed Care – PPO | Attending: Hematology and Oncology | Admitting: Occupational Therapy

## 2021-03-09 DIAGNOSIS — I972 Postmastectomy lymphedema syndrome: Secondary | ICD-10-CM | POA: Insufficient documentation

## 2021-03-09 DIAGNOSIS — I890 Lymphedema, not elsewhere classified: Secondary | ICD-10-CM | POA: Insufficient documentation

## 2021-03-09 NOTE — Therapy (Signed)
[facility] [address], [place], [address] Phone: [phone]   Fax:  [phone]  Occupational Therapy Evaluation  Patient Details  Name: Tina Potter MRN: [id number] Date of Birth: [birth year] Referring Provider (OT): Dr Lindi Adie   Encounter Date: 03/09/2021   OT End of Session - 03/09/21 1137     Visit Number 1    Number of Visits 8    Date for OT Re-Evaluation 05/04/21    OT Start Time 0946    OT Stop Time 1026    OT Time Calculation (min) 40 min    Activity Tolerance Patient tolerated treatment well    Behavior During Therapy [facility] for tasks assessed/performed             Past Medical History:  Diagnosis Date   Breast cancer ([place])    Breast cancer ([place]) 08/18/2011   Lymphedema of arm    Personal history of chemotherapy 2009   Personal history of radiation therapy 2009    Past Surgical History:  Procedure Laterality Date   BILATERAL OOPHORECTOMY  12/2008   BREAST BIOPSY Right 03/05/2008   x2   BREAST BIOPSY Left 04/07/2010   BREAST BIOPSY Left 06/06/2014   MASTECTOMY Right 2009   MASTECTOMY, MODIFIED RADICAL W/RECONSTRUCTION  01/2008    There were no vitals filed for this visit.   Subjective Assessment - 03/09/21 1030     Subjective  It happend again - cellulitis in that R forearm ,elbow and upper arm -finished with my antibiotics- since last year wear night time compession 2 x wk maybe, daytime 1 x -and did good with the pumping -but then after trip or out of town -and stop then -try and do it 1 x day if I am good    Pertinent History Onset of cancer in 2009. 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 Mastectomy with right breast with reconstruction.  She was diagnosed with lymphedema in the past and has had treatment , had cellulitis flareup 2021- not doing HEP , and had tx - return this date again for flareup with lymphedema - cellulitis episode - refer to OT for mangement and tx     Patient Stated Goals Would like to get my lymphedema under control again and what I can do at home to keep it under control    Currently in Pain? Yes    Pain Score 5     Pain Location Elbow    Pain Orientation Right    Pain Descriptors / Indicators Tender;Aching    Pain Type Chronic pain    Pain Onset More than a month ago    Pain Frequency Intermittent               [facility] OT Assessment - 03/09/21 0001       Assessment   Medical Diagnosis R UE lymphedema    Referring Provider (OT) Dr Lindi Adie    Onset Date/Surgical Date 02/23/21    Hand Dominance Right    Prior Therapy 2021 - last tx for R UE lympedema with cellulitis      Precautions   Precaution Comments R UE lymphedema      Home  Environment   Lives With Spouse      Prior Function   Vocation Full time employment    Leisure travel , family time              LYMPHEDEMA/ONCOLOGY QUESTIONNAIRE - 03/09/21 0001  Right Upper Extremity Lymphedema   15 cm Proximal to Olecranon Process 33 cm    10 cm Proximal to Olecranon Process 33 cm    Olecranon Process 27.5 cm    15 cm Proximal to Ulnar Styloid Process 27 cm    10 cm Proximal to Ulnar Styloid Process 24 cm    Just Proximal to Ulnar Styloid Process 17 cm    Across Hand at [employer] 9 cm    At [place] of 2nd Digit 6.2 cm    At [facility] of Thumb 6.4 cm      Left Upper Extremity Lymphedema   15 cm Proximal to Olecranon Process 33 cm    10 cm Proximal to Olecranon Process 31 cm    Olecranon Process 26.5 cm    15 cm Proximal to Ulnar Styloid Process 24.4 cm    10 cm Proximal to Ulnar Styloid Process 21 cm    Just Proximal to Ulnar Styloid Process 16 cm    Across Hand at [employer] 18 cm    At [place] of 2nd Digit 6 cm    At [facility] of Thumb 6.1 cm                Pt to assess if pump's chambers work appropriately and effectively Husband to calibrate Reidsleeve for night time- gradient - making sure forearm lymph drain And wear Jobst Daytime  compression every day   R lat epicondyle pain - After shower  Massage lat epicondyle  do forearm extensor stretch - extended arm - neutral and open hand  5 x 5 sec Ice massage  Avoid extention of wrist , gripping , or picking up - use palms or forearms             OT Education - 03/09/21 1137     Education Details findings and HEP    Person(s) Educated Patient    Methods Explanation;Demonstration;Tactile cues;Verbal cues;Handout    Comprehension Returned demonstration;Verbalized understanding;Verbal cues required                 OT Long Term Goals - 03/09/21 1148       OT LONG TERM GOAL #3   Title Patient will demonstrate HEP to  decrease in circumferential  in R forearm compare to previous and L UE circumference .    Baseline Pt had some weight loss -and all other measurements about same or decrease- but R forearm increased 0.5 cm    Time 4    Period Weeks    Status New    Target Date 04/06/21      OT LONG TERM GOAL #4   Title Patient to be independent in use of day time and night time garments for management of lymphedema symptoms.    Baseline Pt had flare up of R UE lymphedema and Cellulitis 2 wks ago - in need for new daytime - and night time compression- pump to be reassess by Rep    Time 8    Period Weeks    Status New    Target Date 05/04/21                   Plan - 03/09/21 1138     Clinical Impression Statement Pt had lymphedema since 2011 using different compression sleeves - Reidsleeve at night time , Jobst Elvarex soft sleeve - and then since last year flareup - had pump she used. Pt present this eval with another flareup 2 wks  ago with cellulitis - she admit not wearing her compression sleeves for daytime , night time and pump as she should -  this date R UE  circumference increase  in R dominant UE- mostly in forearm   with some elbow pain over lateral epicondyle 5/10 - pt ed on some HEP for lateral epicondyle - pt to use her pump and  see if all chambers are working appropriately , wear Jobs Elvarex sleeve every day -and then has her husband  calibrate her Reidsleeve again for correct gradient - to decrease R forearm circumference - pt to do that for week and return for reassesment -  if forearm decrease will send her to DME for measurements for new compressoin garments- pt asking for another night compression - Reidsleeve very old - cont to have  fibrosis more in R forearm - pt can benefit from OT services to decongest and setup pt with correct and appropriate compression homeprogram to prevent future cellulits flareups and increase compliance    OT Occupational Profile and History Detailed Assessment- Review of Records and additional review of physical, cognitive, psychosocial history related to current functional performance    Occupational performance deficits (Please refer to evaluation for details): ADL's;IADL's;Work;Leisure    Body Structure / Function / Physical Skills ADL;Strength;Pain;Edema;UE functional use;IADL;ROM    Psychosocial Skills Environmental  Adaptations;Routines and Behaviors;Habits;Coping Strategies    Rehab Potential Excellent    Clinical Decision Making Limited treatment options, no task modification necessary    Comorbidities Affecting Occupational Performance: May have comorbidities impacting occupational performance    Modification or Assistance to Complete Evaluation  No modification of tasks or assist necessary to complete eval    OT Frequency 1x / week    OT Duration 8 weeks    OT Treatment/Interventions Self-care/ADL training;Manual lymph drainage;Therapeutic exercise;Patient/family education;Coping strategies training;Compression bandaging;DME and/or AE instruction    Consulted and Agree with Plan of Care Patient             Patient will benefit from skilled therapeutic intervention in order to improve the following deficits and impairments:   Body Structure / Function / Physical Skills: ADL,  Strength, Pain, Edema, UE functional use, IADL, ROM   Psychosocial Skills: Environmental  Adaptations, Routines and Behaviors, Habits, Coping Strategies   Visit Diagnosis: Postmastectomy lymphedema syndrome - Plan: Ot plan of care cert/re-cert    Problem List Patient Active Problem List   Diagnosis Date Noted   Breast cancer of lower-outer quadrant of right female breast ([place]) 08/18/2011    Rosalyn Gess, OT/L 03/09/2021, 11:55 AM  [facility] [address] [place], [address] Phone: [phone]   Fax:  [phone]  Name: Alyna SHYLIE GOETZE MRN: [id number] Date of Birth: 08-15-1960

## 2021-03-16 ENCOUNTER — Ambulatory Visit: Payer: BC Managed Care – PPO | Admitting: Occupational Therapy

## 2021-03-16 DIAGNOSIS — I890 Lymphedema, not elsewhere classified: Secondary | ICD-10-CM

## 2021-03-16 DIAGNOSIS — I972 Postmastectomy lymphedema syndrome: Secondary | ICD-10-CM

## 2021-03-16 NOTE — Therapy (Signed)
[facility] [address], [place], [address] Phone: [phone]   Fax:  [phone]  Occupational Therapy Treatment  Patient Details  Name: Tina Potter MRN: [id number] Date of Birth: 04-07-1961 Referring Provider (OT): Dr Lindi Adie   Encounter Date: 03/16/2021   OT End of Session - 03/16/21 1428     Visit Number 2    Number of Visits 8    Date for OT Re-Evaluation 05/04/21    OT Start Time 1401    OT Stop Time 1420    OT Time Calculation (min) 19 min    Activity Tolerance Patient tolerated treatment well    Behavior During Therapy [facility] for tasks assessed/performed             Past Medical History:  Diagnosis Date   Breast cancer ([place])    Breast cancer ([place]) 08/18/2011   Lymphedema of arm    Personal history of chemotherapy 2009   Personal history of radiation therapy 2009    Past Surgical History:  Procedure Laterality Date   BILATERAL OOPHORECTOMY  12/2008   BREAST BIOPSY Right 03/05/2008   x2   BREAST BIOPSY Left 04/07/2010   BREAST BIOPSY Left 06/06/2014   MASTECTOMY Right 2009   MASTECTOMY, MODIFIED RADICAL W/RECONSTRUCTION  01/2008    There were no vitals filed for this visit.   Subjective Assessment - 03/16/21 1426     Subjective  My elbow pain is better - I did the pump every day except on day when out of town , did Reidsleeve night time and day sleeve during day -husband did look at my night sleeve to calibrate -and pump chambers work    Pertinent History Onset of cancer in 2009. 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 Mastectomy with right breast with reconstruction.  She was diagnosed with lymphedema in the past and has had treatment , had cellulitis flareup 2021- not doing HEP , and had tx - return this date again for flareup with lymphedema - cellulitis episode - refer to OT for mangement and tx    Patient Stated Goals Would like to get my lymphedema under control again  and what I can do at home to keep it under control    Currently in Pain? No/denies                 LYMPHEDEMA/ONCOLOGY QUESTIONNAIRE - 03/16/21 0001       Right Upper Extremity Lymphedema   15 cm Proximal to Olecranon Process 33 cm    10 cm Proximal to Olecranon Process 33 cm    Olecranon Process 27.5 cm    15 cm Proximal to Ulnar Styloid Process 27 cm    10 cm Proximal to Ulnar Styloid Process 23.5 cm    Just Proximal to Ulnar Styloid Process 17.5 cm    Across Hand at [employer] 19 cm             Pt arrive with her Jobst Elvarex soft sleeve on - report doing pump 1-2 x day every day this week except one day , Reidsleeve night time and husband calibrated and then did Jobst during day  Measurement same only one forearm measure decrease 0.5 cm  Pt do have old [place] sleeve we used in the past for her to wear during day on computer - because Jobst not maintaining her - pt to wear and in agreement to wear during day for week and then  reassess in week   Pt report that her pump's chambers work appropriately and effectively Husband to calibrated Reidsleeve for night time- gradient And wear Jobst Daytime compression every day    R lat epicondyle pain  better - no pain or tenderness this date  Pt an cont to do warm shower or After shower  Massage lat epicondyle  do forearm extensor stretch - extended arm - neutral and pronation but loose fist with passive stretch   5 x 5 sec Ice massage  Avoid extention of wrist , gripping , or picking up - use palms or forearms still              OT Education - 03/16/21 1428     Education Details progress and changes to HEP    Person(s) Educated Patient    Methods Explanation;Demonstration;Tactile cues;Verbal cues;Handout    Comprehension Returned demonstration;Verbalized understanding;Verbal cues required                 OT Long Term Goals - 03/09/21 1148       OT LONG TERM GOAL #3   Title Patient will  demonstrate HEP to  decrease in circumferential  in R forearm compare to previous and L UE circumference .    Baseline Pt had some weight loss -and all other measurements about same or decrease- but R forearm increased 0.5 cm    Time 4    Period Weeks    Status New    Target Date 04/06/21      OT LONG TERM GOAL #4   Title Patient to be independent in use of day time and night time garments for management of lymphedema symptoms.    Baseline Pt had flare up of R UE lymphedema and Cellulitis 2 wks ago - in need for new daytime - and night time compression- pump to be reassess by Rep    Time 8    Period Weeks    Status New    Target Date 05/04/21                   Plan - 03/16/21 1429     Clinical Impression Statement Pt had lymphedema since 2011 using different compression sleeves - Reidsleeve at night time , Jobst Elvarex soft sleeve - and then since last year's flareup - had pump she used. Pt present last week with another flareup 3 wks ago with cellulitis - she admit not wearing her compression sleeves for daytime , night time and pump as she should - Pt last week had R UE  circumference increase  in R dominant UE- mostly in forearm   with some elbow pain over lateral epicondyle 5/10 - pt done done HEP this past week for lateral epicondyle - and return this date with great progress with no pain in R elbow - doing stretches - add passive stretch to R UE- P did  use her pump and report  all chambers are working appropriately , wore Jobs Elvarex sleeve every day -and then her husband  did calibrate her Reidsleeve again for correct gradient -SHe showed only on measurement decrease this date compare to last week in forearm - pt to cont again another week with pump, Reidsleeve but use her Jubilee sleeve during day on computer and not Jobst Elverex sleeve that is maybe to loose and year old - pt to return in week for reassesment -  if forearm decrease will send her to DME for measurements for  new  compressoin garments- pt asking for another night compression - Reidsleeve very old - cont to have  fibrosis more in R forearm - pt can benefit from OT services to decongest and setup pt with correct and appropriate compression homeprogram to prevent future cellulits flareups and increase compliance    OT Occupational Profile and History Detailed Assessment- Review of Records and additional review of physical, cognitive, psychosocial history related to current functional performance    Occupational performance deficits (Please refer to evaluation for details): ADL's;IADL's;Work;Leisure    Body Structure / Function / Physical Skills ADL;Strength;Pain;Edema;UE functional use;IADL;ROM    Psychosocial Skills Environmental  Adaptations;Routines and Behaviors;Habits;Coping Strategies    Rehab Potential Excellent    Clinical Decision Making Limited treatment options, no task modification necessary    Comorbidities Affecting Occupational Performance: May have comorbidities impacting occupational performance    Modification or Assistance to Complete Evaluation  No modification of tasks or assist necessary to complete eval    OT Frequency 1x / week    OT Duration 8 weeks    OT Treatment/Interventions Self-care/ADL training;Manual lymph drainage;Therapeutic exercise;Patient/family education;Coping strategies training;Compression bandaging;DME and/or AE instruction    Consulted and Agree with Plan of Care Patient             Patient will benefit from skilled therapeutic intervention in order to improve the following deficits and impairments:   Body Structure / Function / Physical Skills: ADL, Strength, Pain, Edema, UE functional use, IADL, ROM   Psychosocial Skills: Environmental  Adaptations, Routines and Behaviors, Habits, Coping Strategies   Visit Diagnosis: Postmastectomy lymphedema syndrome  Lymphedema, not elsewhere classified    Problem List Patient Active Problem List   Diagnosis  Date Noted   Breast cancer of lower-outer quadrant of right female breast ([place]) 08/18/2011    Rosalyn Gess, OTR/L,CLT 03/16/2021, 2:37 PM  [facility] [address] [place], [address] Phone: [phone]   Fax:  [phone]  Name: Tina Potter MRN: [id number] Date of Birth: [birth year]

## 2021-03-24 ENCOUNTER — Ambulatory Visit: Payer: BC Managed Care – PPO | Admitting: Occupational Therapy

## 2021-03-24 DIAGNOSIS — I890 Lymphedema, not elsewhere classified: Secondary | ICD-10-CM

## 2021-03-24 DIAGNOSIS — I972 Postmastectomy lymphedema syndrome: Secondary | ICD-10-CM | POA: Diagnosis not present

## 2021-03-24 NOTE — Therapy (Signed)
[facility] [address], [place], [address] Phone: [phone]   Fax:  [phone]  Occupational Therapy Treatment  Patient Details  Name: Tina Potter MRN: [id number] Date of Birth: 10/06/1960 Referring Provider (OT): Dr Lindi Adie   Encounter Date: 03/24/2021   OT End of Session - 03/24/21 1529     Visit Number 3    Number of Visits 8    Date for OT Re-Evaluation 05/04/21    OT Start Time 0277    OT Stop Time 1527    OT Time Calculation (min) 34 min    Activity Tolerance Patient tolerated treatment well    Behavior During Therapy [name] for tasks assessed/performed             Past Medical History:  Diagnosis Date   Breast cancer ([place])    Breast cancer ([place]) 08/18/2011   Lymphedema of arm    Personal history of chemotherapy 2009   Personal history of radiation therapy 2009    Past Surgical History:  Procedure Laterality Date   BILATERAL OOPHORECTOMY  12/2008   BREAST BIOPSY Right 03/05/2008   x2   BREAST BIOPSY Left 04/07/2010   BREAST BIOPSY Left 06/06/2014   MASTECTOMY Right 2009   MASTECTOMY, MODIFIED RADICAL W/RECONSTRUCTION  01/2008    There were no vitals filed for this visit.   Subjective Assessment - 03/24/21 1528     Subjective  I wore this sleeve every day while working on the computer and night time Reidsleeve and pump one time day - what do you think - I can feel the bone in my elbow for first time in long time    Pertinent History Onset of cancer in 2009. 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 Mastectomy with right breast with reconstruction.  She was diagnosed with lymphedema in the past and has had treatment , had cellulitis flareup 2021- not doing HEP , and had tx - return this date again for flareup with lymphedema - cellulitis episode - refer to OT for mangement and tx    Patient Stated Goals Would like to get my lymphedema under control again and what I can do at  home to keep it under control    Currently in Pain? No/denies                 LYMPHEDEMA/ONCOLOGY QUESTIONNAIRE - 03/24/21 0001       Right Upper Extremity Lymphedema   15 cm Proximal to Olecranon Process 32.5 cm    10 cm Proximal to Olecranon Process 32.5 cm    Olecranon Process 27.5 cm    15 cm Proximal to Ulnar Styloid Process 27 cm    10 cm Proximal to Ulnar Styloid Process 24 cm    Just Proximal to Ulnar Styloid Process 17.3 cm    Across Hand at [employer] 18.5 cm               Pt arrive with her Jubilee sleeve on - wore it during day every day since last week -  Measurements about same- but upper arm decrease and forearm still increase 3 cm  Readjust straps for Jubilee and add 8 cm short stretch over [place] - from hand , wrist to mid upper arm - but do finger 8's over forearm  pt to wear and in agreement to wear during day for week and then reassess in week - because forearm is area where she every  time get her flare up and cellulitis   Pt report that her pump's chambers work appropriately and effectively Husband to calibrated Reidsleeve for night time- gradient 30 hand and wrist to 20 at upper arm - check again   R lat epicondyle pain  better - no pain or tenderness this date  Pt an cont to do warm shower or After shower  Massage lat epicondyle  do forearm extensor stretch - extended arm - neutral and pronation but loose fist with passive stretch   5 x 5 sec Ice massage  Avoid extention of wrist , gripping , or picking up - use palms or forearms still                     OT Education - 03/24/21 1528     Education Details progress and changes to HEP    Person(s) Educated Patient    Methods Explanation;Demonstration;Tactile cues;Verbal cues;Handout    Comprehension Returned demonstration;Verbalized understanding;Verbal cues required                 OT Long Term Goals - 03/09/21 1148       OT LONG TERM GOAL #3   Title Patient  will demonstrate HEP to  decrease in circumferential  in R forearm compare to previous and L UE circumference .    Baseline Pt had some weight loss -and all other measurements about same or decrease- but R forearm increased 0.5 cm    Time 4    Period Weeks    Status New    Target Date 04/06/21      OT LONG TERM GOAL #4   Title Patient to be independent in use of day time and night time garments for management of lymphedema symptoms.    Baseline Pt had flare up of R UE lymphedema and Cellulitis 2 wks ago - in need for new daytime - and night time compression- pump to be reassess by Rep    Time 8    Period Weeks    Status New    Target Date 05/04/21                   Plan - 03/24/21 1530     Clinical Impression Statement Pt had lymphedema since 2011 using different compression sleeves - Reidsleeve at night time , Jobst Elvarex soft sleeve - and then since last year's flareup - had pump she used. Pt present this time with another flareup 4  wks ago with cellulitis - she admit not wearing her compression sleeves for daytime , night time and pump as she should - Pt's  R UE  circumference  was increased  in R dominant UE- mostly in forearm   with some elbow pain over lateral epicondyle 5/10 - Since tx started she made progress with no pain in R elbow - doing stretches- using her pump and report  all chambers are working appropriately , wore Jobs Elvarex sleeve every day -and then her husband  did calibrate her Reidsleeve again for correct gradient - This past week wore her Jubilee during day while on computer -but forearm stays 3 cm increase compare to others- and upper arm decrease another 0.5 cm - pt. Re adjust her Jubilee straps for forearm to be tighter than upper arm( and add one 8 cm short stretch bandage from hand to mid upper arm over [place]) and husband to recheck gradient again in Reidsleev 30 at hand and wrist to 20 at  upper arm.  -  if forearm decrease will send her to DME for  measurements for new compressoin garments- pt asking for another night compression - Reidsleeve very old - cont to have  fibrosis more in R forearm - pt can benefit from OT services to decongest and setup pt with correct and appropriate compression homeprogram to prevent future cellulits flareups and increase compliance    OT Occupational Profile and History Detailed Assessment- Review of Records and additional review of physical, cognitive, psychosocial history related to current functional performance    Occupational performance deficits (Please refer to evaluation for details): ADL's;IADL's;Work;Leisure    Body Structure / Function / Physical Skills ADL;Strength;Pain;Edema;UE functional use;IADL;ROM    Psychosocial Skills Environmental  Adaptations;Routines and Behaviors;Habits;Coping Strategies    Rehab Potential Excellent    Clinical Decision Making Limited treatment options, no task modification necessary    Comorbidities Affecting Occupational Performance: May have comorbidities impacting occupational performance    Modification or Assistance to Complete Evaluation  No modification of tasks or assist necessary to complete eval    OT Frequency 1x / week    OT Duration 6 weeks    OT Treatment/Interventions Self-care/ADL training;Manual lymph drainage;Therapeutic exercise;Patient/family education;Coping strategies training;Compression bandaging;DME and/or AE instruction    Consulted and Agree with Plan of Care Patient             Patient will benefit from skilled therapeutic intervention in order to improve the following deficits and impairments:   Body Structure / Function / Physical Skills: ADL, Strength, Pain, Edema, UE functional use, IADL, ROM   Psychosocial Skills: Environmental  Adaptations, Routines and Behaviors, Habits, Coping Strategies   Visit Diagnosis: Lymphedema, not elsewhere classified    Problem List Patient Active Problem List   Diagnosis Date Noted   Breast  cancer of lower-outer quadrant of right female breast ([place]) 08/18/2011    Rosalyn Gess, OTR/L,CLT 03/24/2021, 3:35 PM  [facility] [address] [place], [address] Phone: [phone]   Fax:  [phone]  Name: Tina Potter MRN: [id number] Date of Birth: 11-08-1960

## 2021-03-26 ENCOUNTER — Ambulatory Visit: Payer: BC Managed Care – PPO | Admitting: Hematology and Oncology

## 2021-03-30 ENCOUNTER — Ambulatory Visit: Payer: BC Managed Care – PPO | Attending: Hematology and Oncology | Admitting: Occupational Therapy

## 2021-03-30 DIAGNOSIS — I890 Lymphedema, not elsewhere classified: Secondary | ICD-10-CM | POA: Diagnosis not present

## 2021-03-30 DIAGNOSIS — I972 Postmastectomy lymphedema syndrome: Secondary | ICD-10-CM | POA: Diagnosis present

## 2021-03-30 NOTE — Therapy (Signed)
[facility] [address] [place], [address] Phone: [phone]   Fax:  [phone]  Occupational Therapy Treatment  Patient Details  Name: Tina Potter MRN: [id number] Date of Birth: 1960/11/05 Referring Provider (OT): Dr Lindi Adie   Encounter Date: 03/30/2021   OT End of Session - 03/30/21 1658     Visit Number 4    Number of Visits 8    Date for OT Re-Evaluation 05/04/21    OT Start Time 1625    OT Stop Time 1650    OT Time Calculation (min) 25 min    Activity Tolerance Patient tolerated treatment well    Behavior During Therapy [facility] for tasks assessed/performed             Past Medical History:  Diagnosis Date   Breast cancer ([place])    Breast cancer ([place]) 08/18/2011   Lymphedema of arm    Personal history of chemotherapy 2009   Personal history of radiation therapy 2009    Past Surgical History:  Procedure Laterality Date   BILATERAL OOPHORECTOMY  12/2008   BREAST BIOPSY Right 03/05/2008   x2   BREAST BIOPSY Left 04/07/2010   BREAST BIOPSY Left 06/06/2014   MASTECTOMY Right 2009   MASTECTOMY, MODIFIED RADICAL W/RECONSTRUCTION  01/2008    There were no vitals filed for this visit.   Subjective Assessment - 03/30/21 1656     Subjective  I have done what you told me - but then I did help in the yard Saturday and my hand, wrist and forearm swell - and worked to decrease it - but I think I am still increased    Pertinent History Onset of cancer in 2009. 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 Mastectomy with right breast with reconstruction.  She was diagnosed with lymphedema in the past and has had treatment , had cellulitis flareup 2021- not doing HEP , and had tx - return this date again for flareup with lymphedema - cellulitis episode - refer to OT for mangement and tx    Patient Stated Goals Would like to get my lymphedema under control again and what I can do at home to keep it  under control    Currently in Pain? No/denies                 LYMPHEDEMA/ONCOLOGY QUESTIONNAIRE - 03/30/21 0001       Right Upper Extremity Lymphedema   15 cm Proximal to Olecranon Process 32.5 cm    10 cm Proximal to Olecranon Process 32.5 cm    Olecranon Process 27.5 cm    15 cm Proximal to Ulnar Styloid Process 26.5 cm    10 cm Proximal to Ulnar Styloid Process 23 cm    Just Proximal to Ulnar Styloid Process 18 cm    Across Hand at [employer] 19 cm                      Measurements about same- but  forearm did decrease 0.5 to 1 cm - but hand and wrist increase - pt was working in yard    Pt arrive with her Jubilee sleeve on -Readjust straps for [employer] and add 8 cm short stretch over [place]  last time - from hand , wrist to mid upper arm - but do finger 8's over forearm - wore it during day every day since the last 2 wks pt to wear and in  agreement to wear during day  until Thursday and use pump of possible 2  x day to decrease hand and wrist - if possible forearm even or maintain progress - get measure on Thursday or Friday - contact me when she gets her new compression - forearm is area where she every time get her flare up and cellulitis   Pt report that her pump's chambers work appropriately and effectively Husband  calibrated Reidsleeve for night time- gradient 30 hand and wrist to 20 at upper arm             OT Education - 03/30/21 1658     Education Details progress and changes to HEP    Person(s) Educated Patient    Methods Explanation;Demonstration;Tactile cues;Verbal cues;Handout    Comprehension Returned demonstration;Verbalized understanding;Verbal cues required                 OT Long Term Goals - 03/09/21 1148       OT LONG TERM GOAL #3   Title Patient will demonstrate HEP to  decrease in circumferential  in R forearm compare to previous and L UE circumference .    Baseline Pt had some weight loss -and all other  measurements about same or decrease- but R forearm increased 0.5 cm    Time 4    Period Weeks    Status New    Target Date 04/06/21      OT LONG TERM GOAL #4   Title Patient to be independent in use of day time and night time garments for management of lymphedema symptoms.    Baseline Pt had flare up of R UE lymphedema and Cellulitis 2 wks ago - in need for new daytime - and night time compression- pump to be reassess by Rep    Time 8    Period Weeks    Status New    Target Date 05/04/21                   Plan - 03/30/21 1659     Clinical Impression Statement Pt had lymphedema since 2011 using different compression sleeves - Reidsleeve at night time , Jobst Elvarex soft sleeve - and then since last year's flareup - had pump she used. Pt present this time with another flareup 4  wks ago with cellulitis - she admit not wearing her compression sleeves for daytime , night time and pump as she should - Pt's  R UE  circumference  was increased  in R dominant UE- mostly in forearm   with some elbow pain over lateral epicondyle 5/10 - Since tx started she made progress with no pain in R elbow - doing stretches- using her pump and report  all chambers are working appropriately , wore Jobs Elvarex sleeve every day -and then her husband  did calibrate her Reidsleeve again for correct gradient - This past 2 weeks wore her Jubilee during day while on computer -and this week forearm  decrease some , increase by 2 cm compare to L- and upper arm  ready to get measured- Readjusted last week her  Jubilee straps for forearm to be tighter than upper arm( and add one 8 cm short stretch bandage from hand to mid upper arm over [place]) and husband did recheck gradient again in Reidsleev 30 at hand and wrist to 20 at upper arm.  - forearm decrease but hand and wrist did increase because of pt working in yard with not correct compression -  pt to get measure later this week - to do pump if possible 2 x day for the  next 2 days to decrease hand and wrist - plan to get measure  for new compressoin garments- pt asking for another night compression - Reidsleeve very old - cont to have  fibrosis more in R forearm - pt can benefit from OT services to decongest and setup pt with correct and appropriate compression homeprogram to prevent future cellulits flareups and increase compliance    OT Occupational Profile and History Detailed Assessment- Review of Records and additional review of physical, cognitive, psychosocial history related to current functional performance    Occupational performance deficits (Please refer to evaluation for details): ADL's;IADL's;Work;Leisure    Body Structure / Function / Physical Skills ADL;Strength;Pain;Edema;UE functional use;IADL;ROM    Psychosocial Skills Environmental  Adaptations;Routines and Behaviors;Habits;Coping Strategies    Rehab Potential Excellent    Clinical Decision Making Limited treatment options, no task modification necessary    Comorbidities Affecting Occupational Performance: May have comorbidities impacting occupational performance    Modification or Assistance to Complete Evaluation  No modification of tasks or assist necessary to complete eval    OT Frequency 1x / week    OT Duration 6 weeks    OT Treatment/Interventions Self-care/ADL training;Manual lymph drainage;Therapeutic exercise;Patient/family education;Coping strategies training;Compression bandaging;DME and/or AE instruction    Consulted and Agree with Plan of Care Patient             Patient will benefit from skilled therapeutic intervention in order to improve the following deficits and impairments:   Body Structure / Function / Physical Skills: ADL, Strength, Pain, Edema, UE functional use, IADL, ROM   Psychosocial Skills: Environmental  Adaptations, Routines and Behaviors, Habits, Coping Strategies   Visit Diagnosis: Lymphedema, not elsewhere classified  Postmastectomy lymphedema  syndrome    Problem List Patient Active Problem List   Diagnosis Date Noted   Breast cancer of lower-outer quadrant of right female breast ([place]) 08/18/2011    Rosalyn Gess, OTR/L,CLT 03/30/2021, 5:02 PM  [facility] [address], [place], [address] Phone: [phone]   Fax:  [phone]  Name: Tina Potter MRN: [id number] Date of Birth: 1961-05-23

## 2021-04-20 ENCOUNTER — Ambulatory Visit: Payer: BC Managed Care – PPO | Admitting: Occupational Therapy

## 2021-04-20 DIAGNOSIS — I972 Postmastectomy lymphedema syndrome: Secondary | ICD-10-CM

## 2021-04-20 NOTE — Therapy (Signed)
[facility] [address] [place], [address] Phone: [phone]   Fax:  [phone]  Occupational Therapy Screen  Patient Details  Name: Tina Potter MRN: [id number] Date of Birth: 1960/11/26 Referring Provider (OT): Dr Lindi Adie   Encounter Date: 04/20/2021   OT End of Session - 04/20/21 1343     Visit Number 0             Past Medical History:  Diagnosis Date   Breast cancer ([facility])    Breast cancer ([facility]) 08/18/2011   Lymphedema of arm    Personal history of chemotherapy 2009   Personal history of radiation therapy 2009    Past Surgical History:  Procedure Laterality Date   BILATERAL OOPHORECTOMY  12/2008   BREAST BIOPSY Right 03/05/2008   x2   BREAST BIOPSY Left 04/07/2010   BREAST BIOPSY Left 06/06/2014   MASTECTOMY Right 2009   MASTECTOMY, MODIFIED RADICAL W/RECONSTRUCTION  01/2008    There were no vitals filed for this visit.   Subjective Assessment - 04/20/21 1342     Subjective  I went to pick up my new daysleeve -and just come in for you to check and see if fitting okay - I did get new night time one too - wore it last night    Patient Stated Goals Would like to get my lymphedema under control again and what I can do at home to keep it under control    Currently in Pain? No/denies                 LYMPHEDEMA/ONCOLOGY QUESTIONNAIRE - 04/20/21 0001       Right Upper Extremity Lymphedema   15 cm Proximal to Olecranon Process 34 cm    10 cm Proximal to Olecranon Process 33.5 cm    Olecranon Process 28 cm    15 cm Proximal to Ulnar Styloid Process 27.3 cm    10 cm Proximal to Ulnar Styloid Process 23.5 cm    Just Proximal to Ulnar Styloid Process 18 cm    Across Hand at [employer] 19.5 cm               Pt came in for OT to assess fit of new custom Jobst Elvarex soft sleeve  Upon which this OT measure pt's R UE circumference - and all levels increase 1 cm at some  places- pt report she did get new night time sleeve but choose one that had not velcro straps but power sleeve- explain to pt that it is not containing her and she needs one with velcro straps Pt to contact Rep to order one with straps Pt daytime Jobst Elvarex soft sleeve fitting well - pt to go back into her Jubilee/Tribute with bandage during day and Reidsleeve as well as pumping And then contact me when getting new night time                    OT Long Term Goals - 03/09/21 1148       OT LONG TERM GOAL #3   Title Patient will demonstrate HEP to  decrease in circumferential  in R forearm compare to previous and L UE circumference .    Baseline Pt had some weight loss -and all other measurements about same or decrease- but R forearm increased 0.5 cm    Time 4    Period Weeks    Status New    Target  Date 04/06/21      OT LONG TERM GOAL #4   Title Patient to be independent in use of day time and night time garments for management of lymphedema symptoms.    Baseline Pt had flare up of R UE lymphedema and Cellulitis 2 wks ago - in need for new daytime - and night time compression- pump to be reassess by Rep    Time 8    Period Weeks    Status New    Target Date 05/04/21                    Patient will benefit from skilled therapeutic intervention in order to improve the following deficits and impairments:           Visit Diagnosis: Postmastectomy lymphedema syndrome    Problem List Patient Active Problem List   Diagnosis Date Noted   Breast cancer of lower-outer quadrant of right female breast ([facility]) 08/18/2011    Rosalyn Gess, OTR/L,CLT 04/20/2021, 1:44 PM  [facility] [address] [place], [address] Phone: [phone]   Fax:  [phone]  Name: Tina Potter MRN: [id number] Date of Birth: 1961/03/31

## 2021-12-10 ENCOUNTER — Other Ambulatory Visit: Payer: Self-pay | Admitting: Hematology and Oncology

## 2021-12-10 DIAGNOSIS — Z1231 Encounter for screening mammogram for malignant neoplasm of breast: Secondary | ICD-10-CM

## 2021-12-23 ENCOUNTER — Ambulatory Visit: Payer: BC Managed Care – PPO

## 2021-12-31 ENCOUNTER — Ambulatory Visit
Admission: RE | Admit: 2021-12-31 | Discharge: 2021-12-31 | Disposition: A | Discharge status: Home / Self Care | Payer: BC Managed Care – PPO | Source: Ambulatory Visit | Attending: Hematology and Oncology | Admitting: Hematology and Oncology

## 2021-12-31 DIAGNOSIS — Z1231 Encounter for screening mammogram for malignant neoplasm of breast: Secondary | ICD-10-CM

## 2022-01-10 IMAGING — MG DIGITAL SCREENING UNILAT LEFT W/ TOMO W/ CAD
6 series · 6 of 18 positions shown · non-contrast
Comparison: Previous exam(s).

CLINICAL DATA: Screening.

EXAM:
DIGITAL SCREENING UNILATERAL LEFT MAMMOGRAM WITH CAD AND
TOMOSYNTHESIS
TECHNIQUE: Left screening digital craniocaudal and mediolateral oblique
mammograms were obtained. Left screening digital breast
tomosynthesis was performed. The images were evaluated with
computer-aided detection.

[L CC synth-2D]
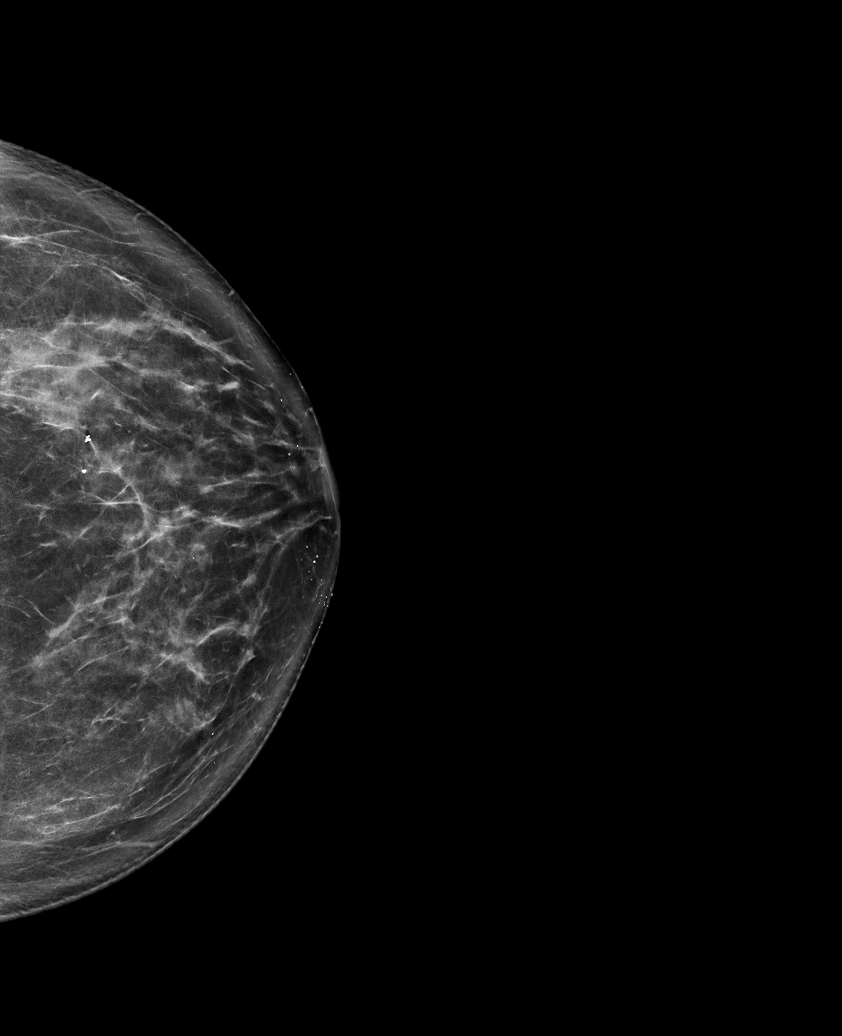

[L MLO synth-2D]
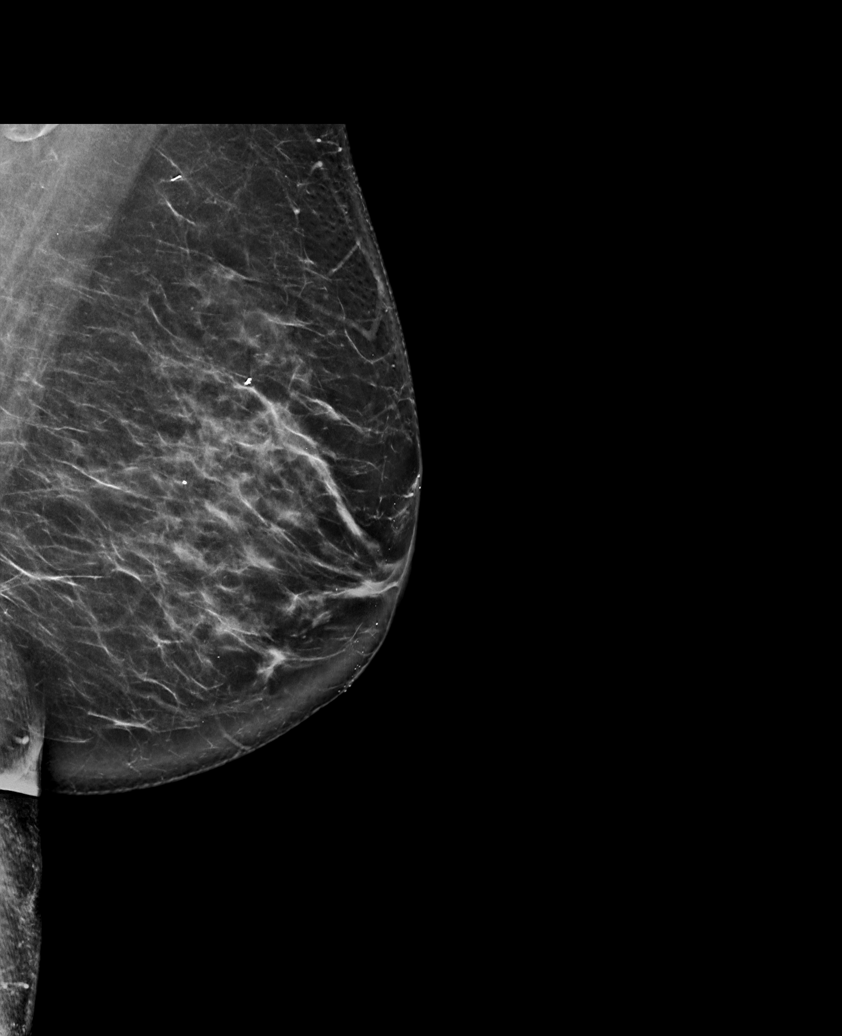

[L XCCL synth-2D]
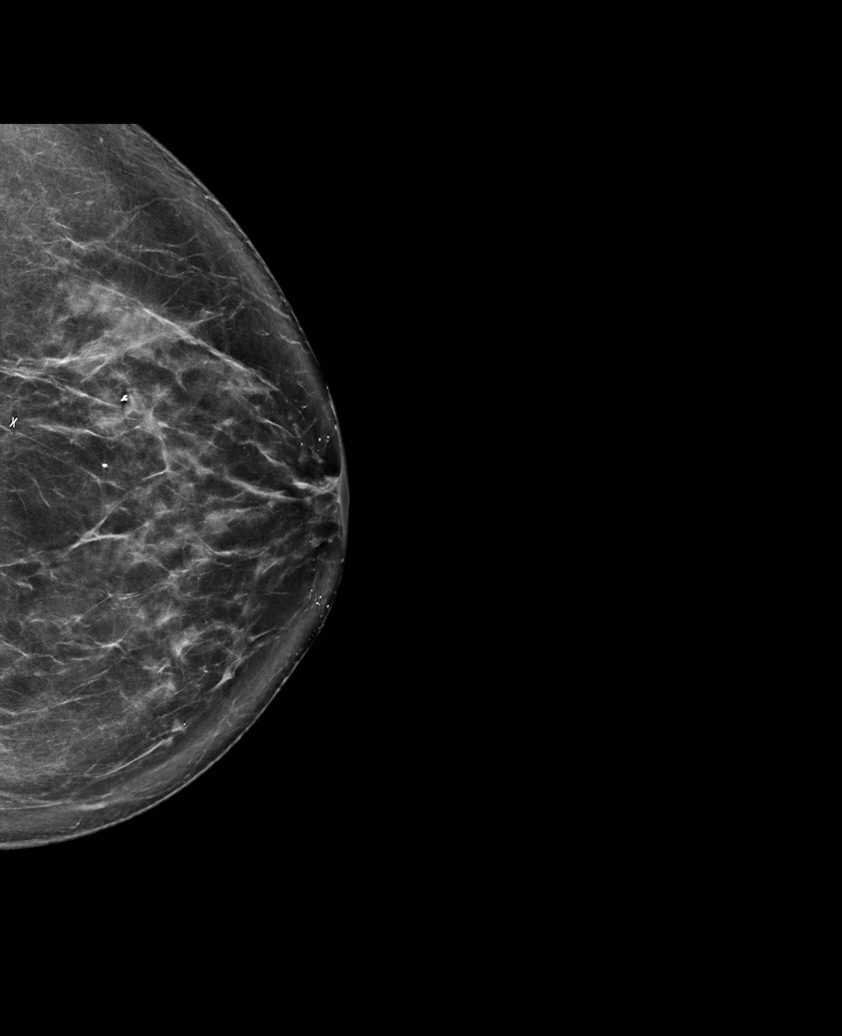

[L CC tomo · tomo slice 44/87.0]
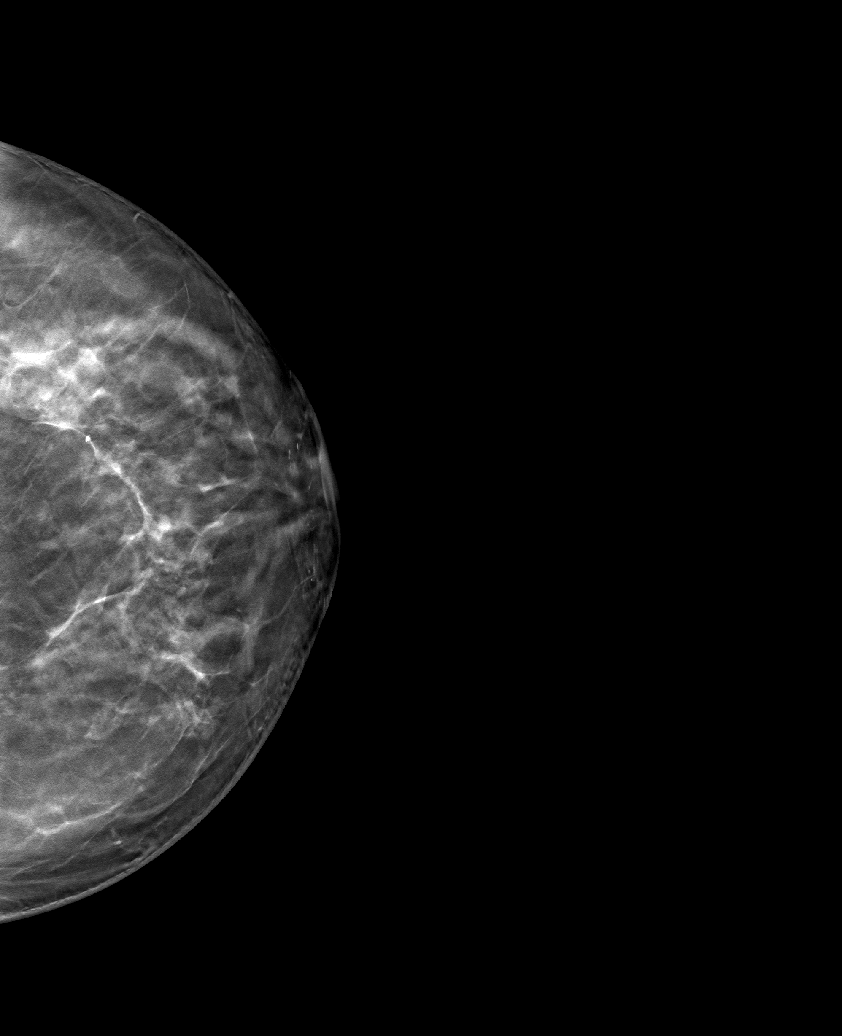

[L XCCL tomo · tomo slice 46/91.0]
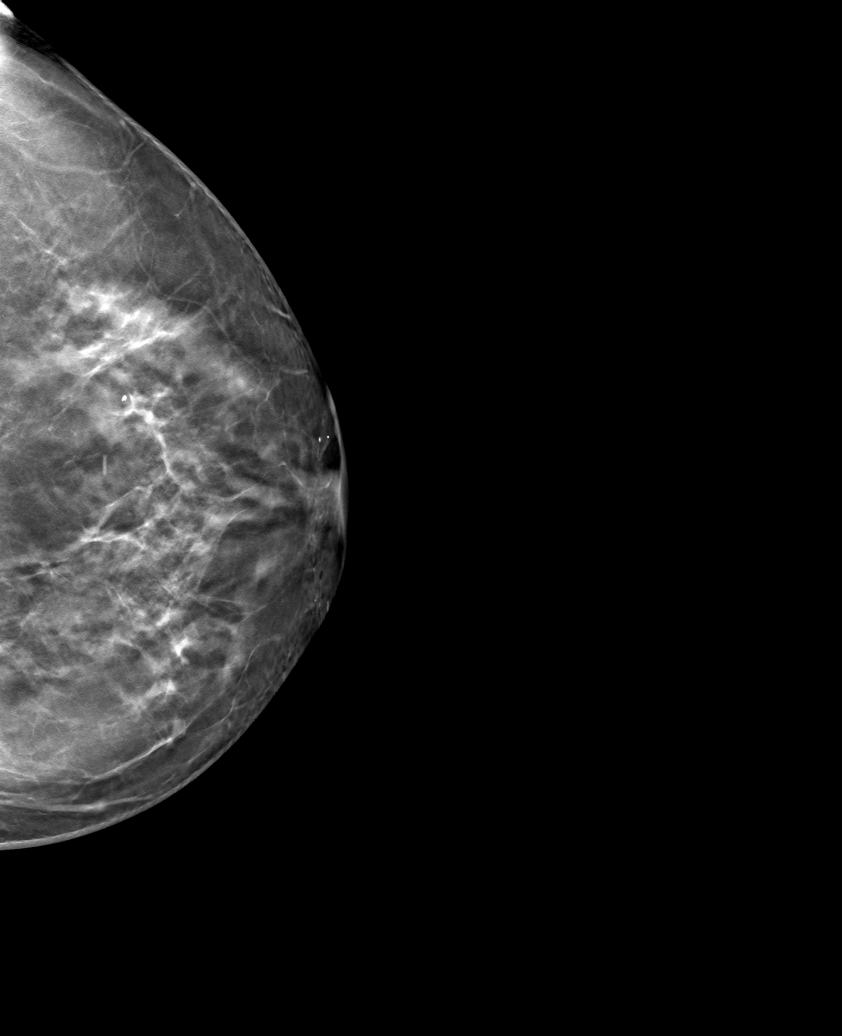

[L MLO tomo · tomo slice 47/93.0]
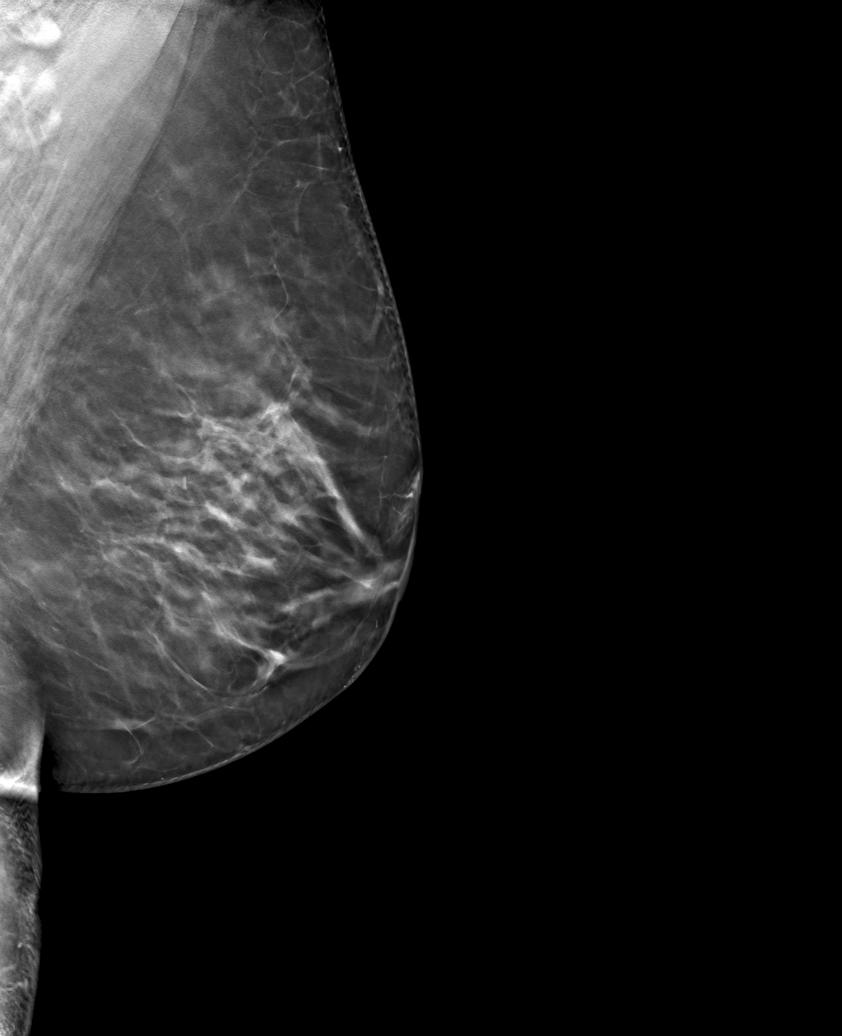

[6 of 18 positions shown; findings below may reference images not displayed]

ACR Breast Density Category c: The breast tissue is heterogeneously
dense, which may obscure small masses.
FINDINGS: There are no findings suspicious for malignancy. The images were
evaluated with computer-aided detection.
IMPRESSION: No mammographic evidence of malignancy. A result letter of this
screening mammogram will be mailed directly to the patient.

RECOMMENDATION:
Screening mammogram in one year. (Code:[id number])

BI-RADS CATEGORY  1: Negative.

## 2022-02-24 ENCOUNTER — Inpatient Hospital Stay: Payer: BC Managed Care – PPO | Attending: Hematology and Oncology | Admitting: Hematology and Oncology

## 2022-02-24 NOTE — Assessment & Plan Note (Deleted)
Right breast cancer stage IIB status post right modified radical mastectomy with axillary lymph node dissection September 2009 treated with adjuvant chemotherapy with TAC x6 cycles and post mastectomy radiation completed 10/15/2008, given tamoxifen from 2007 to 2010 and underwent bilateral salpingo-oophorectomies and start anastrozole 01/14/2009 completed 03/25/2019, undergone TRAM flap reconstruction.  Osteopenia bone density done in November 2015 T score -1.3: Repeat bone density 08/03/2018: T score -1.3 unchanged: On vitamin D supplement   Breast Cancer Surveillance: 1. Breast exam8/31/23: Normal 2. Mammogram 7/7/2023benign, breast density category C  Right arm lymphedema: With cellilitis  Return to clinic in 1 year for follow-up

## 2022-12-16 ENCOUNTER — Other Ambulatory Visit: Payer: Self-pay | Admitting: Hematology and Oncology

## 2022-12-16 DIAGNOSIS — Z1231 Encounter for screening mammogram for malignant neoplasm of breast: Secondary | ICD-10-CM

## 2023-01-17 ENCOUNTER — Ambulatory Visit
Admission: RE | Admit: 2023-01-17 | Discharge: 2023-01-17 | Disposition: A | Discharge status: Home / Self Care | Payer: 59 | Source: Ambulatory Visit | Attending: Hematology and Oncology | Admitting: Hematology and Oncology

## 2023-01-17 DIAGNOSIS — Z1231 Encounter for screening mammogram for malignant neoplasm of breast: Secondary | ICD-10-CM

## 2023-10-27 ENCOUNTER — Other Ambulatory Visit: Payer: Self-pay | Admitting: *Deleted

## 2023-10-27 DIAGNOSIS — C50511 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 female breast: Secondary | ICD-10-CM

## 2023-10-27 NOTE — Progress Notes (Signed)
 Received call from pt stating her lymphedema machine is broken.  Pt states she contacted [facility] in [place] and was advised that a new prescription and updated office note needs to be sent in considering pt has had a change in insurance.  MD visit scheduled.  RN attempt x1 to contact physical therapy in [place] [phone]) to inquire if they would assess pt lymphedema status and order machine.  Office is closed, LVM for office to return call.

## 2023-10-30 ENCOUNTER — Inpatient Hospital Stay: Attending: Hematology and Oncology | Admitting: Hematology and Oncology

## 2023-10-30 VITALS — BP 118/78 | HR 76 | Temp 98.4°F | Resp 18 | Wt 187.4 lb

## 2023-10-30 DIAGNOSIS — Z170 Estrogen receptor positive status [ER+]: Secondary | ICD-10-CM

## 2023-10-30 DIAGNOSIS — Z79899 Other long term (current) drug therapy: Secondary | ICD-10-CM | POA: Insufficient documentation

## 2023-10-30 DIAGNOSIS — Z9221 Personal history of antineoplastic chemotherapy: Secondary | ICD-10-CM | POA: Insufficient documentation

## 2023-10-30 DIAGNOSIS — Z853 Personal history of malignant neoplasm of breast: Secondary | ICD-10-CM | POA: Diagnosis present

## 2023-10-30 DIAGNOSIS — M8580 Other specified disorders of bone density and structure, unspecified site: Secondary | ICD-10-CM | POA: Insufficient documentation

## 2023-10-30 DIAGNOSIS — I890 Lymphedema, not elsewhere classified: Secondary | ICD-10-CM | POA: Insufficient documentation

## 2023-10-30 DIAGNOSIS — Z923 Personal history of irradiation: Secondary | ICD-10-CM | POA: Insufficient documentation

## 2023-10-30 DIAGNOSIS — C50511 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 female breast: Secondary | ICD-10-CM | POA: Diagnosis not present

## 2023-10-30 NOTE — Assessment & Plan Note (Signed)
 Right breast cancer stage IIB status post right modified radical mastectomy with axillary lymph node dissection September 2009 treated with adjuvant chemotherapy with TAC x6 cycles and post mastectomy radiation completed 10/15/2008, given tamoxifen from 2007 to 2010 and underwent bilateral salpingo-oophorectomies and start anastrozole 01/14/2009 completed 03/25/2019, undergone TRAM flap reconstruction.   Osteopenia bone density done in November 2015 T score -1.3: Repeat bone density 08/03/2018: T score -1.3 unchanged: On vitamin D  supplement     Weight: Patient has cut down the food intake and exercising regularly and has reduced her weight. Working from home has made it difficult for her to lose weight.   Breast Cancer Surveillance: 1. Breast exam 10/30/2023: Normal 2. Mammogram 01/18/2023 benign, breast density category C   Return to clinic in 1 year for follow-up

## 2023-10-30 NOTE — Progress Notes (Signed)
 Patient Care Team: Patient, No Pcp Per as PCP - General (General Practice)  DIAGNOSIS:  Encounter Diagnosis  Name Primary?   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 breast of female, estrogen receptor positive (HCC) Yes    SUMMARY OF ONCOLOGIC HISTORY: Oncology History Overview Note  Tamoxifen 20 mg daily stopped   Breast cancer of lower-outer quadrant of right female breast ([facility])  03/17/2008 Surgery   right modified radical mastectomy with excellent lymph node dissection for a stage IIB right breast cancer   04/22/2008 - 08/05/2008 Chemotherapy   Taxotere Adriamycin and Cytoxan for a total of 6 cycles    10/02/2008 - 10/15/2008 Radiation Therapy   Adj XRT   11/04/2008 - 01/13/2009 Anti-estrogen oral therapy   tamoxifen 20 mg daily but this was discontinued   12/08/2008 Surgery   BSO   01/07/2009 -  Anti-estrogen oral therapy   Letrozole , plan for 10 years   05/03/2009 Surgery   right tram flap breast reconstruction; BRCA 1,2 Neg     CHIEF COMPLIANT: Follow-up to discuss lymphedema management  HISTORY OF PRESENT ILLNESS:   History of Present Illness Elgie H Rackham is a 63 year old female with lymphedema who presents with issues related to her lymphedema machine.  Her FlexiTouch vest has malfunctioned due to a component coming out, and it is believed to be beyond repair. She has changed her insurance, and her new primary care provider is unfamiliar with her lymphedema, prompting her to seek specialized care. She has not yet consulted with the physical therapy team in [place] regarding the machine issue, despite attempting to contact them last Friday. Her arm remains swollen, particularly in one area, despite using the machine, indicating inadequate symptom management.     ALLERGIES:  has no known allergies.  MEDICATIONS:  Current Outpatient Medications  Medication Sig Dispense Refill   ibuprofen (ADVIL) 800 MG tablet      venlafaxine  XR (EFFEXOR -XR) 37.5  MG 24 hr capsule TAKE 1 CAPSULE BY MOUTH EVERY MORNING 90 capsule 3   No current facility-administered medications for this visit.    PHYSICAL EXAMINATION: ECOG PERFORMANCE STATUS: 1 - Symptomatic but completely ambulatory  Vitals:   10/30/23 0912  BP: 118/78  Pulse: 76  Resp: 18  Temp: 98.4 F (36.9 C)   Filed Weights   10/30/23 0912  Weight: 187 lb 6.4 oz (85 kg)    Physical Exam   (exam performed in the presence of a chaperone)  LABORATORY DATA:  I have reviewed the data as listed    Latest Ref Rng & Units 06/03/2014    9:06 AM 08/05/2013    2:06 PM 01/31/2013    1:58 PM  CMP  Glucose 70 - 140 mg/dl 90  87  96   BUN 7.0 - 26.0 mg/dL 96.0  45.4  09.8   Creatinine 0.6 - 1.1 mg/dL 0.7  0.7  0.8   Sodium 136 - 145 mEq/L 142  142  144   Potassium 3.5 - 5.1 mEq/L 3.8  3.9  4.0   CO2 22 - 29 mEq/L 26  28  28    Calcium 8.4 - 10.4 mg/dL 9.5  9.7  11.9   Total Protein 6.4 - 8.3 g/dL 7.1  7.3  7.6   Total Bilirubin 0.20 - 1.20 mg/dL 1.47  8.29  5.62   Alkaline Phos 40 - 150 U/L 58  54  56   AST 5 - 34 U/L 20  21  23    ALT 0 -  55 U/L 19  22  18      Lab Results  Component Value Date   WBC 5.1 06/03/2014   HGB 13.4 06/03/2014   HCT 40.6 06/03/2014   MCV 91.1 06/03/2014   PLT 238 06/03/2014   NEUTROABS 2.6 06/03/2014    ASSESSMENT & PLAN:  Breast cancer of lower-outer quadrant of right female breast [facility]) Right breast cancer stage IIB status post right modified radical mastectomy with axillary lymph node dissection September 2009 treated with adjuvant chemotherapy with TAC x6 cycles and post mastectomy radiation completed 10/15/2008, given tamoxifen from 2007 to 2010 and underwent bilateral salpingo-oophorectomies and start anastrozole 01/14/2009 completed 03/25/2019, undergone TRAM flap reconstruction.   Osteopenia bone density done in November 2015 T score -1.3: Repeat bone density 08/03/2018: T score -1.3 unchanged: On vitamin D  supplement     Weight: Patient has cut  down the food intake and exercising regularly and has reduced her weight. Working from home has made it difficult for her to lose weight.   Breast Cancer Surveillance: 1. Breast exam 10/30/2023: Normal 2. Mammogram 01/18/2023 benign, breast density category C   Right arm lymphedema: Current lymphedema pump is broken down.  I will send a referral to physical therapy to measure her advised her and to prescribe a new Flexitouch lymphedema pump.  Return to clinic on an as-needed basis ------------------------------------- Assessment and Plan Assessment & Plan Lymphedema Chronic lymphedema with persistent arm swelling. Current FlexiTouch machine ineffective. [facility] physical therapy referral unsuccessful. - Follow up with [facility] physical therapy regarding the lymphedema machine. - If [facility] physical therapy cannot assist, schedule an appointment with the in-house physical therapist for a one-time visit to obtain necessary measurements for a new machine. - Order a new lymphedema machine based on the measurements provided by the physical therapist. - Ensure she receives an appointment with the lymphoma therapist at the [facility] location in [place].      No orders of the defined types were placed in this encounter.  The patient has a good understanding of the overall plan. she agrees with it. she will call with any problems that may develop before the next visit here. Total time spent: 30 mins including face to face time and time spent for planning, charting and co-ordination of care   Viinay K Adileny Delon, MD 10/30/23

## 2023-11-08 ENCOUNTER — Encounter: Payer: Self-pay | Admitting: Physical Therapy

## 2023-11-08 ENCOUNTER — Ambulatory Visit: Attending: Hematology and Oncology | Admitting: Physical Therapy

## 2023-11-08 ENCOUNTER — Other Ambulatory Visit: Payer: Self-pay

## 2023-11-08 DIAGNOSIS — M79601 Pain in right arm: Secondary | ICD-10-CM | POA: Insufficient documentation

## 2023-11-08 DIAGNOSIS — C50511 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 female breast: Secondary | ICD-10-CM | POA: Diagnosis present

## 2023-11-08 DIAGNOSIS — Z170 Estrogen receptor positive status [ER+]: Secondary | ICD-10-CM | POA: Diagnosis present

## 2023-11-08 DIAGNOSIS — I972 Postmastectomy lymphedema syndrome: Secondary | ICD-10-CM | POA: Insufficient documentation

## 2023-11-08 NOTE — Therapy (Signed)
 OUTPATIENT PHYSICAL THERAPY  UPPER EXTREMITY ONCOLOGY EVALUATION  Patient Name: Tina Potter MRN: [id number] DOB:19-Mar-1961, 63 y.o., female Today's Date: 11/08/2023  END OF SESSION:  PT End of Session - 11/08/23 1029     Visit Number 1    Number of Visits 7    Date for PT Re-Evaluation 12/20/23    PT Start Time 0951    PT Stop Time 1035    PT Time Calculation (min) 44 min    Activity Tolerance Patient tolerated treatment well    Behavior During Therapy [facility] for tasks assessed/performed             Past Medical History:  Diagnosis Date   Breast cancer ([facility])    Breast cancer ([facility]) 08/18/2011   Lymphedema of arm    Personal history of chemotherapy 2009   Personal history of radiation therapy 2009   Past Surgical History:  Procedure Laterality Date   BILATERAL OOPHORECTOMY  12/2008   BREAST BIOPSY Right 03/05/2008   x2   BREAST BIOPSY Left 04/07/2010   BREAST BIOPSY Left 06/06/2014   MASTECTOMY Right 2009   MASTECTOMY, MODIFIED RADICAL W/RECONSTRUCTION  01/2008   Patient Active Problem List   Diagnosis Date Noted   Breast cancer of lower-outer quadrant of right female breast ([facility]) 08/18/2011    PCP: none  REFERRING PROVIDER: Cameron Cea, MD  REFERRING DIAG: C50.511,Z17.0 (ICD-10-CM) - 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 breast of female, estrogen receptor positive ([facility]CC)  THERAPY DIAG:  Postmastectomy lymphedema  Pain in right arm  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 breast of female, estrogen receptor positive ([facility])  ONSET DATE: 2009  Rationale for Evaluation and Treatment: Rehabilitation  SUBJECTIVE:                                                                                                                                                                                           SUBJECTIVE STATEMENT: My swelling stays in my forearm. It never really goes anywhere. I have a sleeve and a night time garment I don't wear it  like I should.   PERTINENT HISTORY: Onset of cancer in 2009. stage IIb malignant neoplasm of the right breast. Mastectomy 03/17/08 with right breast and ALND 3/12, with reconstruction. Completed chemo 08/05/2008 and radation on 10/15/2008. Currently taking letrozole . 05/03/2009 R tram flap breast reconstruction. She was diagnosed with lymphedema in the past and has had treatment , had cellulitis flareup 2021  PAIN:  Are you having pain? Yes NPRS scale: 1/10 Pain location: R UE Pain orientation: Right  PAIN TYPE: aching Pain description: constant  Aggravating factors: nothing, sometimes overuse  and physical activity makes it worse Relieving factors: nothing  PRECAUTIONS: Other: RUE lymphedema  RED FLAGS: None   WEIGHT BEARING RESTRICTIONS: No  FALLS:  Has patient fallen in last 6 months? No  LIVING ENVIRONMENT: Lives with: lives with their spouse Lives in: House/apartment Has following equipment at home: None  OCCUPATION: full time, work for [employer]  LEISURE: does not exercise  HAND DOMINANCE: right   PRIOR LEVEL OF FUNCTION: Independent  PATIENT GOALS: to get the swelling down    OBJECTIVE: Note: Objective measures were completed at Evaluation unless otherwise noted.  COGNITION: Overall cognitive status: Within functional limits for tasks assessed   PALPATION: Fibrosis present throughout RUE especially at forearm  OBSERVATIONS / OTHER ASSESSMENTS: fullness visible throughout RUE especially at wrist and forearm  POSTURE: forward head, rounded shoulders  LYMPHEDEMA ASSESSMENTS:   LANDMARK RIGHT  eval  At axilla  36.6  15 cm proximal to olecranon process 33  10 cm proximal to olecranon process 33.2  Olecranon process 28.9  15 cm proximal to ulnar styloid process 27.6  10 cm proximal to ulnar styloid process 24.9  Just proximal to ulnar styloid process 19.2  Across hand at thumb web space 19.2  At base of 2nd digit 6.2  (Blank rows = not  tested)  LANDMARK LEFT  eval  At axilla  35.8  15 cm proximal to olecranon process 32.7  10 cm proximal to olecranon process 31.6  Olecranon process 27  15 cm proximal to ulnar styloid process 25.1  10 cm proximal to ulnar styloid process 22.4  Just proximal to ulnar styloid process 16.6  Across hand at thumb web space 19  At base of 2nd digit 6.1  (Blank rows = not tested)                                                                                                                            TREATMENT DATE:  11/08/23: Measured pt to see if she would fit in to an off the shelf flat knit. She was only 2 cm out of range at the top of her arm for the exo strong M, and 4 cm outside of range at top of arm for Medi Espirit size II. She would fit in to a sigvaris secure L2 but it may not be enough to contain. Showed pt example of circaid profile. She has reid sleeve but does not wear because it is so heavy. She will bring night time garment to next appointment.     PATIENT EDUCATION:  Education details: flexitouch vs basic pump, need for compression garments for long term management, different types of ready made flat knit sleeves Person educated: Patient Education method: Explanation Education comprehension: verbalized understanding  HOME EXERCISE PROGRAM: None yet  ASSESSMENT:  CLINICAL IMPRESSION: Patient is a 63 y.o. female who was seen today for physical therapy evaluation and treatment for R UE lymphedema. Pt has an airos compression pump but the sleeve  broke and the air tube is no longer connected. She has stage II lymphedema present in her RUE that has been present since 2009. She reports it has gotten worse recently and she has not been able to use her pump. She will also need new day and night time garments because hers are over 30 months old. She has increased discomfort in her RUE secondary to the lymphedema that is constant.  She would benefit from skilled PT services to  decrease RUE lymphedema, assist pt with obtaining a new compression pump and compression garments.    OBJECTIVE IMPAIRMENTS: decreased knowledge of use of DME, increased edema, and pain.   ACTIVITY LIMITATIONS: carrying and lifting  PARTICIPATION LIMITATIONS: community activity  PERSONAL FACTORS: Time since onset of injury/illness/exacerbation are also affecting patient's functional outcome.   REHAB POTENTIAL: Good  CLINICAL DECISION MAKING: Stable/uncomplicated  EVALUATION COMPLEXITY: Low  GOALS: Goals reviewed with patient? Yes  SHORT TERM GOALS=LONG TERM GOALS Target date: 12/20/23  Pt will receive trial of FlexiTouch compression pump for long term management of RUE lymphedema. Baseline: Goal status: INITIAL  2.  Pt will receive new day and night time compression garments for long term management of lymphedema.  Baseline:  Goal status: INITIAL  3.  Pt will demonstrate a 1 cm decrease in lymphedema just proximal to ulnar styloid process to decrease risk of infection.  Baseline:  Goal status: INITIAL   PLAN:  PT FREQUENCY: 1x/week  PT DURATION: 6 weeks  PLANNED INTERVENTIONS: [id number]- PT Re-evaluation, 97110-Therapeutic exercises, 97530- Therapeutic activity, 97112- Neuromuscular re-education, 97535- Self Care, 96045- Manual therapy, 97760- Orthotic Initial, and [phone]- Orthotic/Prosthetic subsequent  PLAN FOR NEXT SESSION: instruct pt and spouse in bandaging technique at first session to be sure they can do independently and at subsequent sessions begin MLD and bandaging  Deedee Farmer, PT 11/08/2023, 10:44 AM

## 2023-11-15 ENCOUNTER — Ambulatory Visit

## 2023-11-15 DIAGNOSIS — C50511 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 female breast: Secondary | ICD-10-CM

## 2023-11-15 DIAGNOSIS — I972 Postmastectomy lymphedema syndrome: Secondary | ICD-10-CM

## 2023-11-15 DIAGNOSIS — M79601 Pain in right arm: Secondary | ICD-10-CM

## 2023-11-15 NOTE — Therapy (Signed)
 OUTPATIENT PHYSICAL THERAPY  UPPER EXTREMITY ONCOLOGY TREATMENT  Patient Name: Tina Potter MRN: [id number] DOB:April 21, 1961, 63 y.o., female Today's Date: 11/15/2023  END OF SESSION:  PT End of Session - 11/15/23 1512     Visit Number 2    Number of Visits 7    Date for PT Re-Evaluation 12/20/23    PT Start Time 1508    PT Stop Time 1602    PT Time Calculation (min) 54 min    Activity Tolerance Patient tolerated treatment well    Behavior During Therapy [facility] for tasks assessed/performed             Past Medical History:  Diagnosis Date   Breast cancer (HCC)    Breast cancer (HCC) 08/18/2011   Lymphedema of arm    Personal history of chemotherapy 2009   Personal history of radiation therapy 2009   Past Surgical History:  Procedure Laterality Date   BILATERAL OOPHORECTOMY  12/2008   BREAST BIOPSY Right 03/05/2008   x2   BREAST BIOPSY Left 04/07/2010   BREAST BIOPSY Left 06/06/2014   MASTECTOMY Right 2009   MASTECTOMY, MODIFIED RADICAL W/RECONSTRUCTION  01/2008   Patient Active Problem List   Diagnosis Date Noted   Breast cancer of lower-outer quadrant of right female breast (HCC) 08/18/2011    PCP: none  REFERRING PROVIDER: Cameron Cea, MD  REFERRING DIAG: C50.511,Z17.0 (ICD-10-CM) - 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 breast of female, estrogen receptor positive (HCC)  THERAPY DIAG:  Postmastectomy lymphedema  Pain in right arm  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 breast of female, estrogen receptor positive (HCC)  ONSET DATE: 2009  Rationale for Evaluation and Treatment: Rehabilitation  SUBJECTIVE:                                                                                                                                                                                           SUBJECTIVE STATEMENT: I don't like my Reidsleeve. I brought my Circaid Profile. I haven't slept in this in awhile but I do like this a lot more  than the Reidsleeve. I need the oversleeve for it though is what my last CLT told me. My husband is here to review the bandaging.   PERTINENT HISTORY: Onset of cancer in 2009. stage IIb malignant neoplasm of the right breast. Mastectomy 03/17/08 with right breast and ALND 3/12, with reconstruction. Completed chemo 08/05/2008 and radation on 10/15/2008. Currently taking letrozole . 05/03/2009 R tram flap breast reconstruction. She was diagnosed with lymphedema in the past and has had treatment , had cellulitis flareup 2021  PAIN:  Are you having pain? Yes  NPRS scale: 1/10 Pain location: R UE Pain orientation: Right  PAIN TYPE: aching Pain description: constant  Aggravating factors: nothing, sometimes overuse and physical activity makes it worse Relieving factors: nothing  PRECAUTIONS: Other: RUE lymphedema  RED FLAGS: None   WEIGHT BEARING RESTRICTIONS: No  FALLS:  Has patient fallen in last 6 months? No  LIVING ENVIRONMENT: Lives with: lives with their spouse Lives in: House/apartment Has following equipment at home: None  OCCUPATION: full time, work for [employer]  LEISURE: does not exercise  HAND DOMINANCE: right   PRIOR LEVEL OF FUNCTION: Independent  PATIENT GOALS: to get the swelling down    OBJECTIVE: Note: Objective measures were completed at Evaluation unless otherwise noted.  COGNITION: Overall cognitive status: Within functional limits for tasks assessed   PALPATION: Fibrosis present throughout RUE especially at forearm  OBSERVATIONS / OTHER ASSESSMENTS: fullness visible throughout RUE especially at wrist and forearm  POSTURE: forward head, rounded shoulders  LYMPHEDEMA ASSESSMENTS:   LANDMARK RIGHT  eval  At axilla  36.6  15 cm proximal to olecranon process 33  10 cm proximal to olecranon process 33.2  Olecranon process 28.9  15 cm proximal to ulnar styloid process 27.6  10 cm proximal to ulnar styloid process 24.9  Just proximal to ulnar  styloid process 19.2  Across hand at thumb web space 19.2  At base of 2nd digit 6.2  (Blank rows = not tested)  LANDMARK LEFT  eval  At axilla  35.8  15 cm proximal to olecranon process 32.7  10 cm proximal to olecranon process 31.6  Olecranon process 27  15 cm proximal to ulnar styloid process 25.1  10 cm proximal to ulnar styloid process 22.4  Just proximal to ulnar styloid process 16.6  Across hand at thumb web space 19  At base of 2nd digit 6.1  (Blank rows = not tested)                                                                                                                            TREATMENT DATE:  11/15/23: Self Care Spent time assessing pts Circaid Profile which seems to be a great fit. Advised her to try sleeping in it soon so she can see if she wakes up with indetations from the sleeve. If so, it's enough compression. Also though showed them how to order the [facility] for the Profile as well and soze per her Profile tag which is size 4, long.  Manual Therapy Compression Bandaging to Rt UE while instructing pts husband: Thin stockinette, Molelast to finger 1-4, Artiflex x 1 from hand to axilla, then short stretch compression bandages as follows: 1-6 cm to hand/wrist, 1-8 cm spiral with "X" at elbow, and 1-10 cm spiral from wrist to axilla. Next removed bandages and had pts husband return demo while giving feedback throughout. Then removed and therapist applied one more time as her hand felt too tight so offered feedback on how to correct  this to husband and pt, who learned how to give him feedback as well while he's wrapping. With pts permission, he also videorecorded therapist applying bandages and handout issued.    11/08/23: Measured pt to see if she would fit in to an off the shelf flat knit. She was only 2 cm out of range at the top of her arm for the exo strong M, and 4 cm outside of range at top of arm for Medi Espirit size II. She would fit in to a sigvaris secure  L2 but it may not be enough to contain. Showed pt example of circaid profile. She has reid sleeve but does not wear because it is so heavy. She will bring night time garment to next appointment.     PATIENT EDUCATION:  Education details: flexitouch vs basic pump, need for compression garments for long term management, different types of ready made flat knit sleeves Person educated: Patient Education method: Explanation Education comprehension: verbalized understanding  HOME EXERCISE PROGRAM: None yet  ASSESSMENT:  CLINICAL IMPRESSION: Today focus was on reviewing with husband and pt how to apply a compression bandage to Rt UE, see above. Also spent time assessing her Circaid Profile and instructing them in how to order an over sleeve for her Profile.    OBJECTIVE IMPAIRMENTS: decreased knowledge of use of DME, increased edema, and pain.   ACTIVITY LIMITATIONS: carrying and lifting  PARTICIPATION LIMITATIONS: community activity  PERSONAL FACTORS: Time since onset of injury/illness/exacerbation are also affecting patient's functional outcome.   REHAB POTENTIAL: Good  CLINICAL DECISION MAKING: Stable/uncomplicated  EVALUATION COMPLEXITY: Low  GOALS: Goals reviewed with patient? Yes  SHORT TERM GOALS=LONG TERM GOALS Target date: 12/20/23  Pt will receive trial of FlexiTouch compression pump for long term management of RUE lymphedema. Baseline: Goal status: INITIAL  2.  Pt will receive new day and night time compression garments for long term management of lymphedema.  Baseline:  Goal status: INITIAL  3.  Pt will demonstrate a 1 cm decrease in lymphedema just proximal to ulnar styloid process to decrease risk of infection.  Baseline:  Goal status: INITIAL   PLAN:  PT FREQUENCY: 1x/week  PT DURATION: 6 weeks  PLANNED INTERVENTIONS: 97164- PT Re-evaluation, 97110-Therapeutic exercises, 97530- Therapeutic activity, [id number]- Neuromuscular re-education, 97535- Self Care,  97140- Manual therapy, 97760- Orthotic Initial, and 16109- Orthotic/Prosthetic subsequent  PLAN FOR NEXT SESSION: review bandaging technique assessing for independence and at subsequent sessions begin MLD and bandaging  Denyce Flank, PTA 11/15/2023, 5:12 PM

## 2023-11-16 ENCOUNTER — Ambulatory Visit: Payer: Self-pay

## 2023-11-16 DIAGNOSIS — K640 First degree hemorrhoids: Secondary | ICD-10-CM | POA: Diagnosis not present

## 2023-11-16 DIAGNOSIS — Z1211 Encounter for screening for malignant neoplasm of colon: Secondary | ICD-10-CM | POA: Diagnosis present

## 2023-11-16 DIAGNOSIS — D122 Benign neoplasm of ascending colon: Secondary | ICD-10-CM | POA: Diagnosis not present

## 2023-11-22 ENCOUNTER — Encounter: Payer: Self-pay | Admitting: Physical Therapy

## 2023-11-22 ENCOUNTER — Ambulatory Visit: Admitting: Physical Therapy

## 2023-11-22 DIAGNOSIS — M79601 Pain in right arm: Secondary | ICD-10-CM

## 2023-11-22 DIAGNOSIS — I972 Postmastectomy lymphedema syndrome: Secondary | ICD-10-CM

## 2023-11-22 DIAGNOSIS — C50511 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 female breast: Secondary | ICD-10-CM

## 2023-11-22 NOTE — Therapy (Signed)
 OUTPATIENT PHYSICAL THERAPY  UPPER EXTREMITY ONCOLOGY TREATMENT  Patient Name: Tina Potter MRN: [id number] DOB:Jan 19, 1961, 63 y.o., female Today's Date: 11/22/2023  END OF SESSION:  PT End of Session - 11/22/23 0901     Visit Number 3    Number of Visits 7    Date for PT Re-Evaluation 12/20/23    PT Start Time 0802    PT Stop Time 0857    PT Time Calculation (min) 55 min    Activity Tolerance Patient tolerated treatment well    Behavior During Therapy [facility] for tasks assessed/performed              Past Medical History:  Diagnosis Date   Breast cancer (HCC)    Breast cancer (HCC) 08/18/2011   Lymphedema of arm    Personal history of chemotherapy 2009   Personal history of radiation therapy 2009   Past Surgical History:  Procedure Laterality Date   BILATERAL OOPHORECTOMY  12/2008   BREAST BIOPSY Right 03/05/2008   x2   BREAST BIOPSY Left 04/07/2010   BREAST BIOPSY Left 06/06/2014   MASTECTOMY Right 2009   MASTECTOMY, MODIFIED RADICAL W/RECONSTRUCTION  01/2008   Patient Active Problem List   Diagnosis Date Noted   Breast cancer of lower-outer quadrant of right female breast (HCC) 08/18/2011    PCP: none  REFERRING PROVIDER: Cameron Cea, MD  REFERRING DIAG: C50.511,Z17.0 (ICD-10-CM) - 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 breast of female, estrogen receptor positive (HCC)  THERAPY DIAG:  Postmastectomy lymphedema  Pain in right arm  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 breast of female, estrogen receptor positive (HCC)  ONSET DATE: 2009  Rationale for Evaluation and Treatment: Rehabilitation  SUBJECTIVE:                                                                                                                                                                                           SUBJECTIVE STATEMENT: I found the over sleeve for the circaid profile. When I wear it I have the indentations on my arm.   PERTINENT  HISTORY: Onset of cancer in 2009. stage IIb malignant neoplasm of the right breast. Mastectomy 03/17/08 with right breast and ALND 3/12, with reconstruction. Completed chemo 08/05/2008 and radation on 10/15/2008. Currently taking letrozole . 05/03/2009 R tram flap breast reconstruction. She was diagnosed with lymphedema in the past and has had treatment , had cellulitis flareup 2021  PAIN:  Are you having pain? Yes NPRS scale: 2/10 Pain location: R UE Pain orientation: Right  PAIN TYPE: sharp Pain description: intermittent  Aggravating factors: nothing, sometimes overuse and physical activity makes it worse  Relieving factors: nothing  PRECAUTIONS: Other: RUE lymphedema  RED FLAGS: None   WEIGHT BEARING RESTRICTIONS: No  FALLS:  Has patient fallen in last 6 months? No  LIVING ENVIRONMENT: Lives with: lives with their spouse Lives in: House/apartment Has following equipment at home: None  OCCUPATION: full time, work for [employer]  LEISURE: does not exercise  HAND DOMINANCE: right   PRIOR LEVEL OF FUNCTION: Independent  PATIENT GOALS: to get the swelling down    OBJECTIVE: Note: Objective measures were completed at Evaluation unless otherwise noted.  COGNITION: Overall cognitive status: Within functional limits for tasks assessed   PALPATION: Fibrosis present throughout RUE especially at forearm  OBSERVATIONS / OTHER ASSESSMENTS: fullness visible throughout RUE especially at wrist and forearm  POSTURE: forward head, rounded shoulders  LYMPHEDEMA ASSESSMENTS:   LANDMARK RIGHT  eval  At axilla  36.6  15 cm proximal to olecranon process 33  10 cm proximal to olecranon process 33.2  Olecranon process 28.9  15 cm proximal to ulnar styloid process 27.6  10 cm proximal to ulnar styloid process 24.9  Just proximal to ulnar styloid process 19.2  Across hand at thumb web space 19.2  At base of 2nd digit 6.2  (Blank rows = not tested)  LANDMARK LEFT  eval  At  axilla  35.8  15 cm proximal to olecranon process 32.7  10 cm proximal to olecranon process 31.6  Olecranon process 27  15 cm proximal to ulnar styloid process 25.1  10 cm proximal to ulnar styloid process 22.4  Just proximal to ulnar styloid process 16.6  Across hand at thumb web space 19  At base of 2nd digit 6.1  (Blank rows = not tested)                                                                                                                            TREATMENT DATE:  11/22/23: In supine: Short neck, 5 diaphragmatic breaths, L axillary nodes and establishment of interaxillary pathway, R inguinal nodes and establishment of axilloinguinal pathway, then R UE working proximal to distal, moving inner upper arm outwards and upwards, and doing both sides of forearms, spending extra time in any areas of fibrosis then retracing all steps. Educated pt and spouse in anatomy and physiology of the lymphatic system and basics of MLD throughout.  Compression Bandaging to Rt UE: Thick stockinette, Molelast to finger 1-4, Artiflex x 1 from hand to axilla, then short stretch compression bandages as follows: 1-6 cm to hand, 1-8 cm spiral with "X" at elbow, and 1-10 cm spiral from wrist to axilla.  11/15/23: Self Care Spent time assessing pts Circaid Profile which seems to be a great fit. Advised her to try sleeping in it soon so she can see if she wakes up with indetations from the sleeve. If so, it's enough compression. Also though showed them how to order the [facility] for the Profile as well and soze per her Profile tag  which is size 4, long.  Manual Therapy Compression Bandaging to Rt UE while instructing pts husband: Thin stockinette, Molelast to finger 1-4, Artiflex x 1 from hand to axilla, then short stretch compression bandages as follows: 1-6 cm to hand/wrist, 1-8 cm spiral with "X" at elbow, and 1-10 cm spiral from wrist to axilla. Next removed bandages and had pts husband return demo while  giving feedback throughout. Then removed and therapist applied one more time as her hand felt too tight so offered feedback on how to correct this to husband and pt, who learned how to give him feedback as well while he's wrapping. With pts permission, he also videorecorded therapist applying bandages and handout issued.    11/08/23: Measured pt to see if she would fit in to an off the shelf flat knit. She was only 2 cm out of range at the top of her arm for the exo strong M, and 4 cm outside of range at top of arm for Medi Espirit size II. She would fit in to a sigvaris secure L2 but it may not be enough to contain. Showed pt example of circaid profile. She has reid sleeve but does not wear because it is so heavy. She will bring night time garment to next appointment.     PATIENT EDUCATION:  Education details: flexitouch vs basic pump, need for compression garments for long term management, different types of ready made flat knit sleeves Person educated: Patient Education method: Explanation Education comprehension: verbalized understanding  HOME EXERCISE PROGRAM: None yet  ASSESSMENT:  CLINICAL IMPRESSION: Began MLD to RUE and instructed pt and spouse in anatomy and physiology of the lymphatic system. Will instruct pt at next session and have her return demo. She has been wearing the circaid night time garment and found the over sleeve. Reapplied compression bandaging at end of session.    OBJECTIVE IMPAIRMENTS: decreased knowledge of use of DME, increased edema, and pain.   ACTIVITY LIMITATIONS: carrying and lifting  PARTICIPATION LIMITATIONS: community activity  PERSONAL FACTORS: Time since onset of injury/illness/exacerbation are also affecting patient's functional outcome.   REHAB POTENTIAL: Good  CLINICAL DECISION MAKING: Stable/uncomplicated  EVALUATION COMPLEXITY: Low  GOALS: Goals reviewed with patient? Yes  SHORT TERM GOALS=LONG TERM GOALS Target date: 12/20/23  Pt  will receive trial of FlexiTouch compression pump for long term management of RUE lymphedema. Baseline: Goal status: INITIAL  2.  Pt will receive new day and night time compression garments for long term management of lymphedema.  Baseline:  Goal status: INITIAL  3.  Pt will demonstrate a 1 cm decrease in lymphedema just proximal to ulnar styloid process to decrease risk of infection.  Baseline:  Goal status: INITIAL   PLAN:  PT FREQUENCY: 1x/week  PT DURATION: 6 weeks  PLANNED INTERVENTIONS: 97164- PT Re-evaluation, 97110-Therapeutic exercises, 97530- Therapeutic activity, [id number]- Neuromuscular re-education, 97535- Self Care, [id number]- Manual therapy, 97760- Orthotic Initial, and [id number]- Orthotic/Prosthetic subsequent  PLAN FOR NEXT SESSION: instruct in self MLD technique, review bandaging technique assessing for independence and at subsequent sessions begin MLD and bandaging  [employer], PT 11/22/2023, 9:05 AM

## 2023-11-28 ENCOUNTER — Ambulatory Visit: Attending: Hematology and Oncology

## 2023-11-28 DIAGNOSIS — C50511 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 female breast: Secondary | ICD-10-CM | POA: Insufficient documentation

## 2023-11-28 DIAGNOSIS — I972 Postmastectomy lymphedema syndrome: Secondary | ICD-10-CM | POA: Diagnosis present

## 2023-11-28 DIAGNOSIS — Z170 Estrogen receptor positive status [ER+]: Secondary | ICD-10-CM | POA: Diagnosis present

## 2023-11-28 DIAGNOSIS — M79601 Pain in right arm: Secondary | ICD-10-CM | POA: Diagnosis present

## 2023-11-28 NOTE — Therapy (Signed)
 OUTPATIENT PHYSICAL THERAPY  UPPER EXTREMITY ONCOLOGY TREATMENT  Patient Name: Tina Potter MRN: [id number] DOB:[birth year], 63 y.o., female Today's Date: 11/28/2023  END OF SESSION:  PT End of Session - 11/28/23 0900     Visit Number 4    Number of Visits 7    Date for PT Re-Evaluation 12/20/23    PT Start Time 0900    PT Stop Time 0956    PT Time Calculation (min) 56 min    Activity Tolerance Patient tolerated treatment well    Behavior During Therapy [facility] for tasks assessed/performed              Past Medical History:  Diagnosis Date   Breast cancer (HCC)    Breast cancer (HCC) 08/18/2011   Lymphedema of arm    Personal history of chemotherapy 2009   Personal history of radiation therapy 2009   Past Surgical History:  Procedure Laterality Date   BILATERAL OOPHORECTOMY  12/2008   BREAST BIOPSY Right 03/05/2008   x2   BREAST BIOPSY Left 04/07/2010   BREAST BIOPSY Left 06/06/2014   MASTECTOMY Right 2009   MASTECTOMY, MODIFIED RADICAL W/RECONSTRUCTION  01/2008   Patient Active Problem List   Diagnosis Date Noted   Breast cancer of lower-outer quadrant of right female breast (HCC) 08/18/2011    PCP: none  REFERRING PROVIDER: Cameron Cea, MD  REFERRING DIAG: C50.511,Z17.0 (ICD-10-CM) - 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 breast of female, estrogen receptor positive (HCC)  THERAPY DIAG:  Postmastectomy lymphedema  Pain in right arm  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 breast of female, estrogen receptor positive (HCC)  ONSET DATE: 2009  Rationale for Evaluation and Treatment: Rehabilitation  SUBJECTIVE:                                                                                                                                                                                           SUBJECTIVE STATEMENT: The Circaid profile is doing great at night. My husband did his best wrap last night. We've been doing it every day.    PERTINENT HISTORY: Onset of cancer in 2009. stage IIb malignant neoplasm of the right breast. Mastectomy 03/17/08 with right breast and ALND 3/12, with reconstruction. Completed chemo 08/05/2008 and radation on 10/15/2008. Currently taking letrozole . 05/03/2009 R tram flap breast reconstruction. She was diagnosed with lymphedema in the past and has had treatment , had cellulitis flareup 2021  PAIN:  Are you having pain? No   PRECAUTIONS: Other: RUE lymphedema  RED FLAGS: None   WEIGHT BEARING RESTRICTIONS: No  FALLS:  Has patient fallen in last 6 months? No  LIVING ENVIRONMENT: Lives with: lives with their spouse Lives in: House/apartment Has following equipment at home: None  OCCUPATION: full time, work for [employer]  LEISURE: does not exercise  HAND DOMINANCE: right   PRIOR LEVEL OF FUNCTION: Independent  PATIENT GOALS: to get the swelling down    OBJECTIVE: Note: Objective measures were completed at Evaluation unless otherwise noted.  COGNITION: Overall cognitive status: Within functional limits for tasks assessed   PALPATION: Fibrosis present throughout RUE especially at forearm  OBSERVATIONS / OTHER ASSESSMENTS: fullness visible throughout RUE especially at wrist and forearm  POSTURE: forward head, rounded shoulders  LYMPHEDEMA ASSESSMENTS:   LANDMARK RIGHT  eval  At axilla  36.6  15 cm proximal to olecranon process 33  10 cm proximal to olecranon process 33.2  Olecranon process 28.9  15 cm proximal to ulnar styloid process 27.6  10 cm proximal to ulnar styloid process 24.9  Just proximal to ulnar styloid process 19.2  Across hand at thumb web space 19.2  At base of 2nd digit 6.2  (Blank rows = not tested)  LANDMARK LEFT  eval  At axilla  35.8  15 cm proximal to olecranon process 32.7  10 cm proximal to olecranon process 31.6  Olecranon process 27  15 cm proximal to ulnar styloid process 25.1  10 cm proximal to ulnar styloid process 22.4   Just proximal to ulnar styloid process 16.6  Across hand at thumb web space 19  At base of 2nd digit 6.1  (Blank rows = not tested)                                                                                                                            TREATMENT DATE:  11/28/23: Manual Therapy In supine: Short neck, 5 diaphragmatic breaths, Lt axillary nodes and establishment of anterior inter-axillary pathway, Rt inguinal nodes and establishment of Rt axillo-inguinal pathway, then Rt UE working proximal to distal, moving inner upper arm outwards and upwards, and doing both sides of forearms, retracing all steps. Had pt return demo for all steps up to foreram. Pt required max tactile cues for correct pressure and skin stretch. Compression Bandaging to Rt UE: Thick stockinette, Molelast to finger 1-4, Artiflex x 1 from hand to axilla, then short stretch compression bandages as follows: 1-6 cm to hand, 1-8 cm spiral with "X" at elbow, and 1-10 cm spiral from wrist to axilla.  11/22/23: In supine: Short neck, 5 diaphragmatic breaths, L axillary nodes and establishment of interaxillary pathway, R inguinal nodes and establishment of axilloinguinal pathway, then R UE working proximal to distal, moving inner upper arm outwards and upwards, and doing both sides of forearms, spending extra time in any areas of fibrosis then retracing all steps. Educated pt and spouse in anatomy and physiology of the lymphatic system and basics of MLD throughout.  Compression Bandaging to Rt UE: Thick stockinette, Molelast to finger 1-4, Artiflex x 1 from hand to axilla, then short  stretch compression bandages as follows: 1-6 cm to hand, 1-8 cm spiral with "X" at elbow, and 1-10 cm spiral from wrist to axilla.   11/15/23: Self Care Spent time assessing pts Circaid Profile which seems to be a great fit. Advised her to try sleeping in it soon so she can see if she wakes up with indetations from the sleeve. If so, it's enough  compression. Also though showed them how to order the [facility] for the Profile as well and soze per her Profile tag which is size 4, long.  Manual Therapy Compression Bandaging to Rt UE while instructing pts husband: Thin stockinette, Molelast to finger 1-4, Artiflex x 1 from hand to axilla, then short stretch compression bandages as follows: 1-6 cm to hand/wrist, 1-8 cm spiral with "X" at elbow, and 1-10 cm spiral from wrist to axilla. Next removed bandages and had pts husband return demo while giving feedback throughout. Then removed and therapist applied one more time as her hand felt too tight so offered feedback on how to correct this to husband and pt, who learned how to give him feedback as well while he's wrapping. With pts permission, he also videorecorded therapist applying bandages and handout issued.    11/08/23: Measured pt to see if she would fit in to an off the shelf flat knit. She was only 2 cm out of range at the top of her arm for the exo strong M, and 4 cm outside of range at top of arm for Medi Espirit size II. She would fit in to a sigvaris secure L2 but it may not be enough to contain. Showed pt example of circaid profile. She has reid sleeve but does not wear because it is so heavy. She will bring night time garment to next appointment.     PATIENT EDUCATION:  Education details: Self MLD Person educated: Patient Education method: Explanation, demonstration, tactile and VC's and handout issued Education comprehension: verbalized understanding, returned demo, tactile cues and will benefit from further review  HOME EXERCISE PROGRAM: Compression bandaging (husband does this) Self MLD (husband will help with this)  ASSESSMENT:  CLINICAL IMPRESSION: Husband is doing better with bandaging each time he dons these for pt. Began instructing pt in self MLD while performing today and had pt return demo of most steps today. Hand over hand reinforcement required for correct pressure  and skin stretch. She will benefit from further review of this and husband would like to be instructed next as well.    OBJECTIVE IMPAIRMENTS: decreased knowledge of use of DME, increased edema, and pain.   ACTIVITY LIMITATIONS: carrying and lifting  PARTICIPATION LIMITATIONS: community activity  PERSONAL FACTORS: Time since onset of injury/illness/exacerbation are also affecting patient's functional outcome.   REHAB POTENTIAL: Good  CLINICAL DECISION MAKING: Stable/uncomplicated  EVALUATION COMPLEXITY: Low  GOALS: Goals reviewed with patient? Yes  SHORT TERM GOALS=LONG TERM GOALS Target date: 12/20/23  Pt will receive trial of FlexiTouch compression pump for long term management of RUE lymphedema. Baseline: Goal status: INITIAL  2.  Pt will receive new day and night time compression garments for long term management of lymphedema.  Baseline:  Goal status: INITIAL  3.  Pt will demonstrate a 1 cm decrease in lymphedema just proximal to ulnar styloid process to decrease risk of infection.  Baseline:  Goal status: INITIAL   PLAN:  PT FREQUENCY: 1x/week  PT DURATION: 6 weeks  PLANNED INTERVENTIONS: 97164- PT Re-evaluation, 97110-Therapeutic exercises, 97530- Therapeutic activity, [id number]- Neuromuscular re-education, 97535- Self  Care, 16109- Manual therapy, [id number]- Orthotic Initial, and [id number]- Orthotic/Prosthetic subsequent  PLAN FOR NEXT SESSION: Review self MLD technique and instruct husband, review bandaging technique prn.  Denyce Flank, PTA 11/28/2023, 10:35 AM   Cancer Rehab [phone]  Start with circles near neck placing hands behind collarbones. 5-10 times each side.   Deep Effective Breath   Standing, sitting, or laying down, place both hands on the belly. Take a deep breath IN, expanding the belly; then breath OUT, contracting the belly. Repeat __5__ times. Do __2-3__ sessions per day and before your self massage.  Axilla to Axilla - Sweep   On  uninvolved side make 5 circles in the armpit, then pump _5__ times from involved armpit across chest to uninvolved armpit, making a pathway. Do _1__ time per day.  Copyright  VHI. All rights reserved.  Axilla to Inguinal Nodes - Sweep   On involved side, make 5 circles at groin at panty line, then pump _5__ times from armpit along side of trunk to outer hip, making your other pathway. Do __1_ time per day.  Copyright  VHI. All rights reserved.  Arm Posterior: Elbow to Shoulder - Sweep   Pump _5__ times from back of elbow to top of shoulder. Then inner to outer upper arm _5_ times, then outer arm again _5_ times. Then back to the pathways _2-3_ times. Do _1__ time per day.  Copyright  VHI. All rights reserved.  ARM: Volar Wrist to Elbow - Sweep   Pump or stationary circles _5__ times from wrist to elbow making sure to do both sides of the forearm. Then retrace your steps to the outer arm, and the pathways _2-3_ times each. Do _1__ time per day.  Copyright  VHI. All rights reserved.  ARM: Dorsum of Hand to Shoulder - Sweep   Pump or stationary circles _5__ times on back of hand including knuckle spaces and individual fingers if needed working up towards the wrist, then retrace all your steps working back up the forearm, doing both sides; upper outer arm and back to your pathways _2-3_ times each. Then do 5 circles again at uninvolved armpit and involved groin where you started! Good job!! Do __1_ time per day.

## 2023-11-28 NOTE — Patient Instructions (Addendum)
 Tina Potter

## 2023-12-04 ENCOUNTER — Ambulatory Visit

## 2023-12-04 DIAGNOSIS — I972 Postmastectomy lymphedema syndrome: Secondary | ICD-10-CM

## 2023-12-04 DIAGNOSIS — M79601 Pain in right arm: Secondary | ICD-10-CM

## 2023-12-04 DIAGNOSIS — C50511 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 female breast: Secondary | ICD-10-CM

## 2023-12-04 NOTE — Therapy (Signed)
 OUTPATIENT PHYSICAL THERAPY  UPPER EXTREMITY ONCOLOGY TREATMENT  Patient Name: Tina Potter MRN: [id number] DOB:23-Aug-1960, 63 y.o., female Today's Date: 12/04/2023  END OF SESSION:  PT End of Session - 12/04/23 1408     Visit Number 5    Number of Visits 7    Date for PT Re-Evaluation 12/20/23    PT Start Time 1401    PT Stop Time 1458    PT Time Calculation (min) 57 min    Activity Tolerance Patient tolerated treatment well    Behavior During Therapy [facility] for tasks assessed/performed              Past Medical History:  Diagnosis Date   Breast cancer (HCC)    Breast cancer (HCC) 08/18/2011   Lymphedema of arm    Personal history of chemotherapy 2009   Personal history of radiation therapy 2009   Past Surgical History:  Procedure Laterality Date   BILATERAL OOPHORECTOMY  12/2008   BREAST BIOPSY Right 03/05/2008   x2   BREAST BIOPSY Left 04/07/2010   BREAST BIOPSY Left 06/06/2014   MASTECTOMY Right 2009   MASTECTOMY, MODIFIED RADICAL W/RECONSTRUCTION  01/2008   Patient Active Problem List   Diagnosis Date Noted   Breast cancer of lower-outer quadrant of right female breast (HCC) 08/18/2011    PCP: none  REFERRING PROVIDER: Cameron Cea, MD  REFERRING DIAG: C50.511,Z17.0 (ICD-10-CM) - 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 breast of female, estrogen receptor positive (HCC)  THERAPY DIAG:  Postmastectomy lymphedema  Pain in right arm  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 breast of female, estrogen receptor positive (HCC)  ONSET DATE: 2009  Rationale for Evaluation and Treatment: Rehabilitation  SUBJECTIVE:                                                                                                                                                                                           SUBJECTIVE STATEMENT: The bandaging is going well with my husband doing it.   PERTINENT HISTORY: Onset of cancer in 2009. stage IIb  malignant neoplasm of the right breast. Mastectomy 03/17/08 with right breast and ALND 3/12, with reconstruction. Completed chemo 08/05/2008 and radation on 10/15/2008. Currently taking letrozole . 05/03/2009 R tram flap breast reconstruction. She was diagnosed with lymphedema in the past and has had treatment , had cellulitis flareup 2021  PAIN:  Are you having pain? No   PRECAUTIONS: Other: RUE lymphedema  RED FLAGS: None   WEIGHT BEARING RESTRICTIONS: No  FALLS:  Has patient fallen in last 6 months? No  LIVING ENVIRONMENT: Lives with: lives with their spouse Lives in: House/apartment  Has following equipment at home: None  OCCUPATION: full time, work for [employer]  LEISURE: does not exercise  HAND DOMINANCE: right   PRIOR LEVEL OF FUNCTION: Independent  PATIENT GOALS: to get the swelling down    OBJECTIVE: Note: Objective measures were completed at Evaluation unless otherwise noted.  COGNITION: Overall cognitive status: Within functional limits for tasks assessed   PALPATION: Fibrosis present throughout RUE especially at forearm  OBSERVATIONS / OTHER ASSESSMENTS: fullness visible throughout RUE especially at wrist and forearm  POSTURE: forward head, rounded shoulders  LYMPHEDEMA ASSESSMENTS:   LANDMARK RIGHT  eval Right 12/04/23  At axilla  36.6 35.2  15 cm proximal to olecranon process 33 32.2  10 cm proximal to olecranon process 33.2 30.8  Olecranon process 28.9 27.5  15 cm proximal to ulnar styloid process 27.6 26.7  10 cm proximal to ulnar styloid process 24.9 24.4  Just proximal to ulnar styloid process 19.2 17.7  Across hand at thumb web space 19.2 18.7  At base of 2nd digit 6.2 5.9  (Blank rows = not tested)  LANDMARK LEFT  eval  At axilla  35.8  15 cm proximal to olecranon process 32.7  10 cm proximal to olecranon process 31.6  Olecranon process 27  15 cm proximal to ulnar styloid process 25.1  10 cm proximal to ulnar styloid process 22.4   Just proximal to ulnar styloid process 16.6  Across hand at thumb web space 19  At base of 2nd digit 6.1  (Blank rows = not tested)                                                                                                                            TREATMENT DATE:  12/04/23: Manual Therapy and Self Care In supine: Short neck, 5 diaphragmatic breaths, Lt axillary nodes and establishment of anterior inter-axillary pathway, Rt inguinal nodes and establishment of Rt axillo-inguinal pathway, then Rt UE working proximal to distal, moving inner upper arm outwards and upwards, and doing both sides of forearms, retracing all steps. Had husband return demo for all steps. He required mod tactile cues for correct pressure and skin stretch, pt was also instructed to provide feedback to husband throughout. Compression Bandaging to Rt UE: Thick stockinette, Molelast to finger 1-4, Artiflex x 1 from hand to axilla, then short stretch compression bandages as follows: 1-6 cm to hand, 1-8 cm spiral with "X" at elbow, and 1-10 cm spiral from wrist to axilla.  11/28/23: Manual Therapy In supine: Short neck, 5 diaphragmatic breaths, Lt axillary nodes and establishment of anterior inter-axillary pathway, Rt inguinal nodes and establishment of Rt axillo-inguinal pathway, then Rt UE working proximal to distal, moving inner upper arm outwards and upwards, and doing both sides of forearms, retracing all steps. Had pt return demo for all steps up to foreram. Pt required max tactile cues for correct pressure and skin stretch. Compression Bandaging to Rt UE: Thick stockinette, Molelast to finger 1-4, Artiflex  x 1 from hand to axilla, then short stretch compression bandages as follows: 1-6 cm to hand, 1-8 cm spiral with "X" at elbow, and 1-10 cm spiral from wrist to axilla.  11/22/23: In supine: Short neck, 5 diaphragmatic breaths, L axillary nodes and establishment of interaxillary pathway, R inguinal nodes and establishment  of axilloinguinal pathway, then R UE working proximal to distal, moving inner upper arm outwards and upwards, and doing both sides of forearms, spending extra time in any areas of fibrosis then retracing all steps. Educated pt and spouse in anatomy and physiology of the lymphatic system and basics of MLD throughout.  Compression Bandaging to Rt UE: Thick stockinette, Molelast to finger 1-4, Artiflex x 1 from hand to axilla, then short stretch compression bandages as follows: 1-6 cm to hand, 1-8 cm spiral with "X" at elbow, and 1-10 cm spiral from wrist to axilla.   11/15/23: Self Care Spent time assessing pts Circaid Profile which seems to be a great fit. Advised her to try sleeping in it soon so she can see if she wakes up with indetations from the sleeve. If so, it's enough compression. Also though showed them how to order the [facility] for the Profile as well and soze per her Profile tag which is size 4, long.  Manual Therapy Compression Bandaging to Rt UE while instructing pts husband: Thin stockinette, Molelast to finger 1-4, Artiflex x 1 from hand to axilla, then short stretch compression bandages as follows: 1-6 cm to hand/wrist, 1-8 cm spiral with "X" at elbow, and 1-10 cm spiral from wrist to axilla. Next removed bandages and had pts husband return demo while giving feedback throughout. Then removed and therapist applied one more time as her hand felt too tight so offered feedback on how to correct this to husband and pt, who learned how to give him feedback as well while he's wrapping. With pts permission, he also videorecorded therapist applying bandages and handout issued.    11/08/23: Measured pt to see if she would fit in to an off the shelf flat knit. She was only 2 cm out of range at the top of her arm for the exo strong M, and 4 cm outside of range at top of arm for Medi Espirit size II. She would fit in to a sigvaris secure L2 but it may not be enough to contain. Showed pt example of  circaid profile. She has reid sleeve but does not wear because it is so heavy. She will bring night time garment to next appointment.     PATIENT EDUCATION:  Education details: Self MLD Person educated: Patient Education method: Explanation, demonstration, tactile and VC's and handout issued Education comprehension: verbalized understanding, returned demo, tactile cues and will benefit from further review  HOME EXERCISE PROGRAM: Compression bandaging (husband does this) Self MLD (husband will help with this)  ASSESSMENT:  CLINICAL IMPRESSION: They are independent with bandaging at home at this time. Today focused on instructing husband MLD having him return demo after therapist demonstrated. He required cuing for lighter pressure and more effective skin stretch. Encouraged pt to provide VC's when he is performing for reinforcement of proper technique. They will be on vacation next week so probably measure pt for compression garments when she returns if her measurements are further reduced.    OBJECTIVE IMPAIRMENTS: decreased knowledge of use of DME, increased edema, and pain.   ACTIVITY LIMITATIONS: carrying and lifting  PARTICIPATION LIMITATIONS: community activity  PERSONAL FACTORS: Time since onset of injury/illness/exacerbation  are also affecting patient's functional outcome.   REHAB POTENTIAL: Good  CLINICAL DECISION MAKING: Stable/uncomplicated  EVALUATION COMPLEXITY: Low  GOALS: Goals reviewed with patient? Yes  SHORT TERM GOALS=LONG TERM GOALS Target date: 12/20/23  Pt will receive trial of FlexiTouch compression pump for long term management of RUE lymphedema. Baseline: Goal status: INITIAL  2.  Pt will receive new day and night time compression garments for long term management of lymphedema.  Baseline:  Goal status: INITIAL  3.  Pt will demonstrate a 1 cm decrease in lymphedema just proximal to ulnar styloid process to decrease risk of infection.  Baseline:   Goal status: INITIAL   PLAN:  PT FREQUENCY: 1x/week  PT DURATION: 6 weeks  PLANNED INTERVENTIONS: 97164- PT Re-evaluation, 97110-Therapeutic exercises, 97530- Therapeutic activity, [id number]- Neuromuscular re-education, 97535- Self Care, 95638- Manual therapy, 97760- Orthotic Initial, and [id number]- Orthotic/Prosthetic subsequent  PLAN FOR NEXT SESSION: Review self MLD technique and how is husband doing? Plan to measure for flat knit garments, try Medi Mondi?  Denyce Flank, PTA 12/04/2023, 4:16 PM   Cancer Rehab [phone]  Start with circles near neck placing hands behind collarbones. 5-10 times each side.   Deep Effective Breath   Standing, sitting, or laying down, place both hands on the belly. Take a deep breath IN, expanding the belly; then breath OUT, contracting the belly. Repeat __5__ times. Do __2-3__ sessions per day and before your self massage.  Axilla to Axilla - Sweep   On uninvolved side make 5 circles in the armpit, then pump _5__ times from involved armpit across chest to uninvolved armpit, making a pathway. Do _1__ time per day.  Copyright  VHI. All rights reserved.  Axilla to Inguinal Nodes - Sweep   On involved side, make 5 circles at groin at panty line, then pump _5__ times from armpit along side of trunk to outer hip, making your other pathway. Do __1_ time per day.  Copyright  VHI. All rights reserved.  Arm Posterior: Elbow to Shoulder - Sweep   Pump _5__ times from back of elbow to top of shoulder. Then inner to outer upper arm _5_ times, then outer arm again _5_ times. Then back to the pathways _2-3_ times. Do _1__ time per day.  Copyright  VHI. All rights reserved.  ARM: Volar Wrist to Elbow - Sweep   Pump or stationary circles _5__ times from wrist to elbow making sure to do both sides of the forearm. Then retrace your steps to the outer arm, and the pathways _2-3_ times each. Do _1__ time per day.  Copyright  VHI. All rights  reserved.  ARM: Dorsum of Hand to Shoulder - Sweep   Pump or stationary circles _5__ times on back of hand including knuckle spaces and individual fingers if needed working up towards the wrist, then retrace all your steps working back up the forearm, doing both sides; upper outer arm and back to your pathways _2-3_ times each. Then do 5 circles again at uninvolved armpit and involved groin where you started! Good job!! Do __1_ time per day.

## 2023-12-15 ENCOUNTER — Encounter

## 2023-12-21 ENCOUNTER — Ambulatory Visit

## 2023-12-21 DIAGNOSIS — I972 Postmastectomy lymphedema syndrome: Secondary | ICD-10-CM | POA: Diagnosis not present

## 2023-12-21 DIAGNOSIS — M79601 Pain in right arm: Secondary | ICD-10-CM

## 2023-12-21 DIAGNOSIS — Z170 Estrogen receptor positive status [ER+]: Secondary | ICD-10-CM

## 2023-12-21 NOTE — Therapy (Addendum)
 OUTPATIENT PHYSICAL THERAPY  UPPER EXTREMITY ONCOLOGY TREATMENT  Patient Name: Tina Potter MRN: [id number] DOB:[birth year], 63 y.o., female Today's Date: 12/21/2023  END OF SESSION:  PT End of Session - 12/21/23 0909     Visit Number 6    Number of Visits 7    Date for PT Re-Evaluation 12/20/23    PT Start Time 0905    PT Stop Time 1000    PT Time Calculation (min) 55 min    Activity Tolerance Patient tolerated treatment well    Behavior During Therapy [facility] for tasks assessed/performed           Past Medical History:  Diagnosis Date   Breast cancer (HCC)    Breast cancer (HCC) 08/18/2011   Lymphedema of arm    Personal history of chemotherapy 2009   Personal history of radiation therapy 2009   Past Surgical History:  Procedure Laterality Date   BILATERAL OOPHORECTOMY  12/2008   BREAST BIOPSY Right 03/05/2008   x2   BREAST BIOPSY Left 04/07/2010   BREAST BIOPSY Left 06/06/2014   MASTECTOMY Right 2009   MASTECTOMY, MODIFIED RADICAL W/RECONSTRUCTION  01/2008   Patient Active Problem List   Diagnosis Date Noted   Breast cancer of lower-outer quadrant of right female breast (HCC) 08/18/2011    PCP: none  REFERRING PROVIDER: Mackey Chad, MD  REFERRING DIAG: C50.511,Z17.0 (ICD-10-CM) - 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 breast of female, estrogen receptor positive (HCC)  THERAPY DIAG:  Postmastectomy lymphedema  Pain in right arm  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 breast of female, estrogen receptor positive (HCC)  ONSET DATE: 2009  Rationale for Evaluation and Treatment: Rehabilitation  SUBJECTIVE:                                                                                                                                                                                           SUBJECTIVE STATEMENT: I feel like we have a good handle on everything lymphedema now and am ready to D/C today. I'm not interested in pursuing the  pump at this time because I'm really managing well and I have a really high copay.   PERTINENT HISTORY: Onset of cancer in 2009. stage IIb malignant neoplasm of the right breast. Mastectomy 03/17/08 with right breast and ALND 3/12, with reconstruction. Completed chemo 08/05/2008 and radation on 10/15/2008. Currently taking letrozole . 05/03/2009 R tram flap breast reconstruction. She was diagnosed with lymphedema in the past and has had treatment , had cellulitis flareup 2021  PAIN:  Are you having pain? No   PRECAUTIONS: Other: RUE lymphedema  RED FLAGS: None  WEIGHT BEARING RESTRICTIONS: No  FALLS:  Has patient fallen in last 6 months? No  LIVING ENVIRONMENT: Lives with: lives with their spouse Lives in: House/apartment Has following equipment at home: None  OCCUPATION: full time, work for [employer]  LEISURE: does not exercise  HAND DOMINANCE: right   PRIOR LEVEL OF FUNCTION: Independent  PATIENT GOALS: to get the swelling down    OBJECTIVE: Note: Objective measures were completed at Evaluation unless otherwise noted.  COGNITION: Overall cognitive status: Within functional limits for tasks assessed   PALPATION: Fibrosis present throughout RUE especially at forearm  OBSERVATIONS / OTHER ASSESSMENTS: fullness visible throughout RUE especially at wrist and forearm  POSTURE: forward head, rounded shoulders  LYMPHEDEMA ASSESSMENTS:   LANDMARK RIGHT  eval Right 12/04/23 Right  12/21/23  At axilla  36.6 35.2 34.4  15 cm proximal to olecranon process 33 32.2 31.4  10 cm proximal to olecranon process 33.2 30.8 31.3  Olecranon process 28.9 27.5 27.5  15 cm proximal to ulnar styloid process 27.6 26.7 26.2  10 cm proximal to ulnar styloid process 24.9 24.4 23.5  Just proximal to ulnar styloid process 19.2 17.7 17.7  Across hand at thumb web space 19.2 18.7 18.9  At base of 2nd digit 6.2 5.9 6  (Blank rows = not tested)  LANDMARK LEFT  eval  At axilla  35.8  15 cm  proximal to olecranon process 32.7  10 cm proximal to olecranon process 31.6  Olecranon process 27  15 cm proximal to ulnar styloid process 25.1  10 cm proximal to ulnar styloid process 22.4  Just proximal to ulnar styloid process 16.6  Across hand at thumb web space 19  At base of 2nd digit 6.1  (Blank rows = not tested)                                                                                                                            TREATMENT DATE:  12/21/23: Self Care Assessed pts current functional status and goals. Issued script for day and nighttime garments and pt is going to go to [employer]. Her circumference overall has reduced since eval and her and husband are independent with self MLD and bandaging. She is not going to get a pump now due to high copay. Manual In supine: Short neck, 5 diaphragmatic breaths, Lt axillary nodes and establishment of anterior inter-axillary pathway, Rt inguinal nodes and establishment of Rt axillo-inguinal pathway, then Rt UE working proximal to distal, moving inner upper arm outwards and upwards, and doing both sides of forearms, retracing all steps. Had husband return demo for all steps. He required mod tactile cues for correct pressure and skin stretch, pt was also instructed to provide feedback to husband throughout. Compression Bandaging to Rt UE: Thick stockinette, Molelast to finger 1-4, Artiflex x 1 from hand to axilla, then short stretch compression bandages as follows: 1-6 cm to hand, 1-8 cm spiral with X at elbow, and  1-10 cm spiral from wrist to axilla.   12/04/23: Manual Therapy and Self Care In supine: Short neck, 5 diaphragmatic breaths, Lt axillary nodes and establishment of anterior inter-axillary pathway, Rt inguinal nodes and establishment of Rt axillo-inguinal pathway, then Rt UE working proximal to distal, moving inner upper arm outwards and upwards, and doing both sides of forearms, retracing all steps. Had husband  return demo for all steps. He required mod tactile cues for correct pressure and skin stretch, pt was also instructed to provide feedback to husband throughout. Compression Bandaging to Rt UE: Thick stockinette, Molelast to finger 1-4, Artiflex x 1 from hand to axilla, then short stretch compression bandages as follows: 1-6 cm to hand, 1-8 cm spiral with X at elbow, and 1-10 cm spiral from wrist to axilla.  11/28/23: Manual Therapy In supine: Short neck, 5 diaphragmatic breaths, Lt axillary nodes and establishment of anterior inter-axillary pathway, Rt inguinal nodes and establishment of Rt axillo-inguinal pathway, then Rt UE working proximal to distal, moving inner upper arm outwards and upwards, and doing both sides of forearms, retracing all steps. Had pt return demo for all steps up to foreram. Pt required max tactile cues for correct pressure and skin stretch. Compression Bandaging to Rt UE: Thick stockinette, Molelast to finger 1-4, Artiflex x 1 from hand to axilla, then short stretch compression bandages as follows: 1-6 cm to hand, 1-8 cm spiral with X at elbow, and 1-10 cm spiral from wrist to axilla.  11/22/23: In supine: Short neck, 5 diaphragmatic breaths, L axillary nodes and establishment of interaxillary pathway, R inguinal nodes and establishment of axilloinguinal pathway, then R UE working proximal to distal, moving inner upper arm outwards and upwards, and doing both sides of forearms, spending extra time in any areas of fibrosis then retracing all steps. Educated pt and spouse in anatomy and physiology of the lymphatic system and basics of MLD throughout.  Compression Bandaging to Rt UE: Thick stockinette, Molelast to finger 1-4, Artiflex x 1 from hand to axilla, then short stretch compression bandages as follows: 1-6 cm to hand, 1-8 cm spiral with X at elbow, and 1-10 cm spiral from wrist to axilla.   11/15/23: Self Care Spent time assessing pts Circaid Profile which seems to be a  great fit. Advised her to try sleeping in it soon so she can see if she wakes up with indetations from the sleeve. If so, it's enough compression. Also though showed them how to order the [facility] for the Profile as well and soze per her Profile tag which is size 4, long.  Manual Therapy Compression Bandaging to Rt UE while instructing pts husband: Thin stockinette, Molelast to finger 1-4, Artiflex x 1 from hand to axilla, then short stretch compression bandages as follows: 1-6 cm to hand/wrist, 1-8 cm spiral with X at elbow, and 1-10 cm spiral from wrist to axilla. Next removed bandages and had pts husband return demo while giving feedback throughout. Then removed and therapist applied one more time as her hand felt too tight so offered feedback on how to correct this to husband and pt, who learned how to give him feedback as well while he's wrapping. With pts permission, he also videorecorded therapist applying bandages and handout issued.    11/08/23: Measured pt to see if she would fit in to an off the shelf flat knit. She was only 2 cm out of range at the top of her arm for the exo strong M, and 4 cm outside of  range at top of arm for Medi Espirit size II. She would fit in to a sigvaris secure L2 but it may not be enough to contain. Showed pt example of circaid profile. She has reid sleeve but does not wear because it is so heavy. She will bring night time garment to next appointment.     PATIENT EDUCATION:  Education details: Self MLD Person educated: Patient Education method: Explanation, demonstration, tactile and VC's and handout issued Education comprehension: verbalized understanding, returned demo, tactile cues and will benefit from further review  HOME EXERCISE PROGRAM: Compression bandaging (husband does this) Self MLD (husband will help with this)  ASSESSMENT:  CLINICAL IMPRESSION: Pt has done excellent this episode of care and is ready for D/C at this time.    OBJECTIVE  IMPAIRMENTS: decreased knowledge of use of DME, increased edema, and pain.   ACTIVITY LIMITATIONS: carrying and lifting  PARTICIPATION LIMITATIONS: community activity  PERSONAL FACTORS: Time since onset of injury/illness/exacerbation are also affecting patient's functional outcome.   REHAB POTENTIAL: Good  CLINICAL DECISION MAKING: Stable/uncomplicated  EVALUATION COMPLEXITY: Low  GOALS: Goals reviewed with patient? Yes  SHORT TERM GOALS=LONG TERM GOALS Target date: 12/20/23  Pt will receive trial of FlexiTouch compression pump for long term management of RUE lymphedema. Baseline: Goal status: DEFERRED  2.  Pt will receive new day and night time compression garments for long term management of lymphedema.  Baseline: 12/21/23 - pt is going to get garments at [employer] Goal status: PARTIALLY MET  3.  Pt will demonstrate a 1 cm decrease in lymphedema just proximal to ulnar styloid process to decrease risk of infection.  Baseline:  Goal status: MET   PLAN:  PT FREQUENCY: 1x/week  PT DURATION: 6 weeks  PLANNED INTERVENTIONS: 97164- PT Re-evaluation, 97110-Therapeutic exercises, 97530- Therapeutic activity, [id number]- Neuromuscular re-education, 97535- Self Care, [id number]- Manual therapy, 97760- Orthotic Initial, and [id number]- Orthotic/Prosthetic subsequent  PLAN FOR NEXT SESSION: D/C this visit.   Aden Berwyn Caldron, PTA 12/21/2023, 10:02 AM   Cancer Rehab [phone]  Start with circles near neck placing hands behind collarbones. 5-10 times each side.   Deep Effective Breath   Standing, sitting, or laying down, place both hands on the belly. Take a deep breath IN, expanding the belly; then breath OUT, contracting the belly. Repeat __5__ times. Do __2-3__ sessions per day and before your self massage.  Axilla to Axilla - Sweep   On uninvolved side make 5 circles in the armpit, then pump _5__ times from involved armpit across chest to uninvolved armpit, making a pathway. Do  _1__ time per day.  Copyright  VHI. All rights reserved.  Axilla to Inguinal Nodes - Sweep   On involved side, make 5 circles at groin at panty line, then pump _5__ times from armpit along side of trunk to outer hip, making your other pathway. Do __1_ time per day.  Copyright  VHI. All rights reserved.  Arm Posterior: Elbow to Shoulder - Sweep   Pump _5__ times from back of elbow to top of shoulder. Then inner to outer upper arm _5_ times, then outer arm again _5_ times. Then back to the pathways _2-3_ times. Do _1__ time per day.  Copyright  VHI. All rights reserved.  ARM: Volar Wrist to Elbow - Sweep   Pump or stationary circles _5__ times from wrist to elbow making sure to do both sides of the forearm. Then retrace your steps to the outer arm, and the pathways _2-3_ times each. Do _1__ time  per day.  Copyright  VHI. All rights reserved.  ARM: Dorsum of Hand to Shoulder - Sweep   Pump or stationary circles _5__ times on back of hand including knuckle spaces and individual fingers if needed working up towards the wrist, then retrace all your steps working back up the forearm, doing both sides; upper outer arm and back to your pathways _2-3_ times each. Then do 5 circles again at uninvolved armpit and involved groin where you started! Good job!! Do __1_ time per day.  PHYSICAL THERAPY DISCHARGE SUMMARY  Visits from Start of Care: 6  Current functional level related to goals / functional outcomes: See above   Remaining deficits: See above   Education / Equipment: MLD, compression garments   Patient agrees to discharge. Patient goals were met. Patient is being discharged due to being pleased with the current functional level.  [facility] [place], [place] 12/28/23 11:10 AM

## 2024-01-25 ENCOUNTER — Other Ambulatory Visit: Payer: Self-pay | Admitting: Hematology and Oncology

## 2024-01-25 DIAGNOSIS — Z1231 Encounter for screening mammogram for malignant neoplasm of breast: Secondary | ICD-10-CM

## 2024-01-31 ENCOUNTER — Encounter: Payer: Self-pay | Admitting: Physician Assistant

## 2024-01-31 ENCOUNTER — Ambulatory Visit: Admitting: Physician Assistant

## 2024-01-31 DIAGNOSIS — L821 Other seborrheic keratosis: Secondary | ICD-10-CM | POA: Diagnosis not present

## 2024-01-31 DIAGNOSIS — L578 Other skin changes due to chronic exposure to nonionizing radiation: Secondary | ICD-10-CM

## 2024-01-31 DIAGNOSIS — Z85828 Personal history of other malignant neoplasm of skin: Secondary | ICD-10-CM

## 2024-01-31 DIAGNOSIS — D1801 Hemangioma of skin and subcutaneous tissue: Secondary | ICD-10-CM

## 2024-01-31 DIAGNOSIS — Z1283 Encounter for screening for malignant neoplasm of skin: Secondary | ICD-10-CM | POA: Diagnosis not present

## 2024-01-31 DIAGNOSIS — L814 Other melanin hyperpigmentation: Secondary | ICD-10-CM | POA: Diagnosis not present

## 2024-01-31 DIAGNOSIS — W908XXA Exposure to other nonionizing radiation, initial encounter: Secondary | ICD-10-CM

## 2024-01-31 DIAGNOSIS — L738 Other specified follicular disorders: Secondary | ICD-10-CM

## 2024-01-31 MED ORDER — TRETINOIN 0.025 % EX CREA: TOPICAL_CREAM | Freq: Every day | CUTANEOUS | 0 refills | Status: AC

## 2024-01-31 NOTE — Progress Notes (Signed)
   New Patient Visit   Subjective  Tina Potter is a 63 y.o. female who presents for the following: Skin Cancer Screening and Full Body Skin Exam  FBSE today. No areas of concern today. Previous hx of BCC. Prior patient at [facility].   The patient presents for Total-Body Skin Exam (TBSE) for skin cancer screening and mole check. The patient has spots, moles and lesions to be evaluated, some may be new or changing and the patient may have concern these could be cancer.    The following portions of the chart were reviewed this encounter and updated as appropriate: medications, allergies, medical history  Review of Systems:  No other skin or systemic complaints except as noted in HPI or Assessment and Plan.  Objective  Well appearing patient in no apparent distress; mood and affect are within normal limits.  A full examination was performed including scalp, head, eyes, ears, nose, lips, neck, chest, axillae, abdomen, back, buttocks, bilateral upper extremities, bilateral lower extremities, hands, feet, fingers, toes, fingernails, and toenails. All findings within normal limits unless otherwise noted below.   Relevant physical exam findings are noted in the Assessment and Plan.    Assessment & Plan   SKIN CANCER SCREENING PERFORMED TODAY.  ACTINIC DAMAGE - Chronic condition, secondary to cumulative UV/sun exposure - diffuse scaly erythematous macules with underlying dyspigmentation - Recommend daily broad spectrum sunscreen SPF 30+ to sun-exposed areas, reapply every 2 hours as needed.  - Staying in the shade or wearing long sleeves, sun glasses (UVA+UVB protection) and wide brim hats (4-inch brim around the entire circumference of the hat) are also recommended for sun protection.  - Call for new or changing lesions.  LENTIGINES, SEBORRHEIC KERATOSES, HEMANGIOMAS - Benign normal skin lesions - Benign-appearing - Call for any changes  MELANOCYTIC NEVI - Tan-brown  and/or pink-flesh-colored symmetric macules and papules - Benign appearing on exam today - Observation - Call clinic for new or changing moles - Recommend daily use of broad spectrum spf 30+ sunscreen to sun-exposed areas.    Sebaceous Hyperplasia Exam:Small yellow papules with a central dell  Treatment: -Benign-appearing - Prescribing Tretinoin  0.025% to apply a pea sie amount every other night building up to nightly.  HISTORY OF BASAL CELL CARCINOMA  - No evidence of recurrence.   HISTORY OF BASAL CELL CARCINOMA (BCC)   SCREENING EXAM FOR SKIN CANCER   LENTIGINES   ACTINIC SKIN DAMAGE   SEBORRHEIC KERATOSIS   CHERRY ANGIOMA   SEBACEOUS HYPERPLASIA OF FACE    Return in about 1 year (around 01/30/2025) for tbse.  I, Gordan Beams, CMA, am acting as scribe for SANDRIDGE,BRENDA K, PA-C.   Documentation: I have reviewed the above documentation for accuracy and completeness, and I agree with the above.  SANDRIDGE,BRENDA K, PA-C

## 2024-02-16 ENCOUNTER — Ambulatory Visit
Admission: RE | Admit: 2024-02-16 | Discharge: 2024-02-16 | Disposition: A | Discharge status: Home / Self Care | Source: Ambulatory Visit | Attending: Hematology and Oncology | Admitting: Hematology and Oncology

## 2024-02-16 ENCOUNTER — Other Ambulatory Visit: Payer: Self-pay | Admitting: Hematology and Oncology

## 2024-02-16 DIAGNOSIS — Z1231 Encounter for screening mammogram for malignant neoplasm of breast: Secondary | ICD-10-CM

## 2025-02-03 ENCOUNTER — Ambulatory Visit: Admitting: Physician Assistant
